# Patient Record
Sex: Male | Born: 1965 | Race: White | Hispanic: No | Marital: Married | Smoking: Never smoker
Health system: Southern US, Community
[De-identification: ages and names within clinical notes are randomized; demographics above are authoritative.]

## PROBLEM LIST (undated history)

## (undated) DIAGNOSIS — F419 Anxiety disorder, unspecified: Secondary | ICD-10-CM

## (undated) DIAGNOSIS — B029 Zoster without complications: Secondary | ICD-10-CM

## (undated) DIAGNOSIS — T7840XA Allergy, unspecified, initial encounter: Secondary | ICD-10-CM

## (undated) DIAGNOSIS — E785 Hyperlipidemia, unspecified: Secondary | ICD-10-CM

## (undated) DIAGNOSIS — J180 Bronchopneumonia, unspecified organism: Secondary | ICD-10-CM

## (undated) DIAGNOSIS — H8110 Benign paroxysmal vertigo, unspecified ear: Secondary | ICD-10-CM

## (undated) DIAGNOSIS — U071 COVID-19: Secondary | ICD-10-CM

## (undated) HISTORY — DX: COVID-19: U07.1

## (undated) HISTORY — DX: Hyperlipidemia, unspecified: E78.5

## (undated) HISTORY — PX: POLYPECTOMY: SHX149

## (undated) HISTORY — PX: COLONOSCOPY: SHX174

## (undated) HISTORY — DX: Benign paroxysmal vertigo, unspecified ear: H81.10

## (undated) HISTORY — DX: Zoster without complications: B02.9

## (undated) HISTORY — DX: Anxiety disorder, unspecified: F41.9

## (undated) HISTORY — DX: Bronchopneumonia, unspecified organism: J18.0

## (undated) HISTORY — DX: Allergy, unspecified, initial encounter: T78.40XA

---

## 2000-10-09 ENCOUNTER — Ambulatory Visit (HOSPITAL_COMMUNITY): Admission: RE | Admit: 2000-10-09 | Discharge: 2000-10-09 | Payer: Self-pay | Admitting: *Deleted

## 2000-10-09 ENCOUNTER — Encounter: Payer: Self-pay | Admitting: *Deleted

## 2005-04-22 ENCOUNTER — Encounter: Admission: RE | Admit: 2005-04-22 | Discharge: 2005-04-22 | Payer: Self-pay | Admitting: Radiology

## 2005-05-02 ENCOUNTER — Ambulatory Visit: Payer: Self-pay | Admitting: Pulmonary Disease

## 2005-05-03 ENCOUNTER — Ambulatory Visit: Payer: Self-pay | Admitting: Pulmonary Disease

## 2005-06-06 HISTORY — PX: VASECTOMY: SHX75

## 2005-07-04 ENCOUNTER — Ambulatory Visit: Payer: Self-pay | Admitting: Pulmonary Disease

## 2005-10-19 ENCOUNTER — Ambulatory Visit: Payer: Self-pay | Admitting: Pulmonary Disease

## 2005-12-26 ENCOUNTER — Ambulatory Visit: Payer: Self-pay | Admitting: Internal Medicine

## 2006-02-08 ENCOUNTER — Ambulatory Visit: Payer: Self-pay | Admitting: Internal Medicine

## 2006-02-10 ENCOUNTER — Ambulatory Visit: Payer: Self-pay | Admitting: Internal Medicine

## 2006-02-17 ENCOUNTER — Ambulatory Visit: Payer: Self-pay | Admitting: Internal Medicine

## 2006-02-21 ENCOUNTER — Ambulatory Visit: Payer: Self-pay | Admitting: Internal Medicine

## 2006-02-23 ENCOUNTER — Ambulatory Visit: Payer: Self-pay | Admitting: Internal Medicine

## 2006-02-28 ENCOUNTER — Ambulatory Visit: Payer: Self-pay | Admitting: Internal Medicine

## 2006-03-02 ENCOUNTER — Ambulatory Visit: Payer: Self-pay | Admitting: Internal Medicine

## 2006-03-07 ENCOUNTER — Ambulatory Visit: Payer: Self-pay | Admitting: Internal Medicine

## 2006-03-10 ENCOUNTER — Ambulatory Visit: Payer: Self-pay | Admitting: Internal Medicine

## 2006-03-13 ENCOUNTER — Ambulatory Visit: Payer: Self-pay | Admitting: Internal Medicine

## 2006-03-14 ENCOUNTER — Ambulatory Visit: Payer: Self-pay | Admitting: Internal Medicine

## 2006-03-17 ENCOUNTER — Ambulatory Visit: Payer: Self-pay | Admitting: Internal Medicine

## 2006-03-21 ENCOUNTER — Ambulatory Visit: Payer: Self-pay | Admitting: Internal Medicine

## 2006-03-24 ENCOUNTER — Ambulatory Visit: Payer: Self-pay | Admitting: Internal Medicine

## 2006-03-28 ENCOUNTER — Ambulatory Visit: Payer: Self-pay | Admitting: Internal Medicine

## 2006-03-31 ENCOUNTER — Ambulatory Visit: Payer: Self-pay | Admitting: Internal Medicine

## 2006-04-03 ENCOUNTER — Ambulatory Visit: Payer: Self-pay | Admitting: Internal Medicine

## 2006-04-04 ENCOUNTER — Ambulatory Visit: Payer: Self-pay | Admitting: Internal Medicine

## 2006-04-11 ENCOUNTER — Ambulatory Visit: Payer: Self-pay | Admitting: Internal Medicine

## 2006-04-14 ENCOUNTER — Ambulatory Visit: Payer: Self-pay | Admitting: Internal Medicine

## 2006-04-18 ENCOUNTER — Ambulatory Visit: Payer: Self-pay | Admitting: Internal Medicine

## 2006-04-20 ENCOUNTER — Ambulatory Visit: Payer: Self-pay | Admitting: Internal Medicine

## 2006-04-24 ENCOUNTER — Ambulatory Visit: Payer: Self-pay | Admitting: Internal Medicine

## 2006-04-26 ENCOUNTER — Ambulatory Visit: Payer: Self-pay | Admitting: Internal Medicine

## 2006-05-01 ENCOUNTER — Ambulatory Visit: Payer: Self-pay | Admitting: Internal Medicine

## 2006-05-02 ENCOUNTER — Ambulatory Visit: Payer: Self-pay | Admitting: Internal Medicine

## 2006-05-04 ENCOUNTER — Ambulatory Visit: Payer: Self-pay | Admitting: Internal Medicine

## 2006-05-08 ENCOUNTER — Ambulatory Visit: Payer: Self-pay | Admitting: Internal Medicine

## 2006-05-12 ENCOUNTER — Ambulatory Visit: Payer: Self-pay | Admitting: Internal Medicine

## 2006-05-16 ENCOUNTER — Ambulatory Visit: Payer: Self-pay | Admitting: Internal Medicine

## 2006-05-19 ENCOUNTER — Ambulatory Visit: Payer: Self-pay | Admitting: Internal Medicine

## 2006-05-24 ENCOUNTER — Ambulatory Visit: Payer: Self-pay | Admitting: Internal Medicine

## 2006-06-02 ENCOUNTER — Ambulatory Visit: Payer: Self-pay | Admitting: Internal Medicine

## 2006-06-06 HISTORY — PX: OTHER SURGICAL HISTORY: SHX169

## 2006-06-08 ENCOUNTER — Ambulatory Visit: Payer: Self-pay | Admitting: Internal Medicine

## 2006-06-16 ENCOUNTER — Ambulatory Visit: Payer: Self-pay | Admitting: Internal Medicine

## 2006-06-20 ENCOUNTER — Ambulatory Visit: Payer: Self-pay | Admitting: Internal Medicine

## 2006-06-27 ENCOUNTER — Ambulatory Visit: Payer: Self-pay | Admitting: Internal Medicine

## 2006-07-06 ENCOUNTER — Ambulatory Visit: Payer: Self-pay | Admitting: Internal Medicine

## 2006-07-10 ENCOUNTER — Ambulatory Visit: Payer: Self-pay | Admitting: Internal Medicine

## 2006-07-17 ENCOUNTER — Ambulatory Visit: Payer: Self-pay | Admitting: Internal Medicine

## 2006-07-26 ENCOUNTER — Ambulatory Visit: Payer: Self-pay | Admitting: Internal Medicine

## 2006-08-07 ENCOUNTER — Ambulatory Visit: Payer: Self-pay | Admitting: Internal Medicine

## 2006-08-11 ENCOUNTER — Ambulatory Visit: Payer: Self-pay | Admitting: Internal Medicine

## 2006-08-18 ENCOUNTER — Ambulatory Visit: Payer: Self-pay | Admitting: Internal Medicine

## 2006-08-23 ENCOUNTER — Ambulatory Visit: Payer: Self-pay | Admitting: Internal Medicine

## 2006-08-31 ENCOUNTER — Ambulatory Visit: Payer: Self-pay | Admitting: Internal Medicine

## 2006-09-04 ENCOUNTER — Ambulatory Visit: Payer: Self-pay | Admitting: Internal Medicine

## 2006-09-11 ENCOUNTER — Ambulatory Visit: Payer: Self-pay | Admitting: Internal Medicine

## 2006-09-25 ENCOUNTER — Ambulatory Visit: Payer: Self-pay | Admitting: Internal Medicine

## 2006-09-26 ENCOUNTER — Ambulatory Visit: Payer: Self-pay | Admitting: Internal Medicine

## 2006-10-04 ENCOUNTER — Ambulatory Visit: Payer: Self-pay | Admitting: Internal Medicine

## 2006-10-13 ENCOUNTER — Ambulatory Visit: Payer: Self-pay | Admitting: Pulmonary Disease

## 2006-10-13 ENCOUNTER — Ambulatory Visit: Payer: Self-pay | Admitting: Internal Medicine

## 2006-10-20 ENCOUNTER — Ambulatory Visit: Payer: Self-pay | Admitting: Internal Medicine

## 2006-10-24 ENCOUNTER — Ambulatory Visit: Payer: Self-pay | Admitting: Internal Medicine

## 2006-10-31 ENCOUNTER — Ambulatory Visit: Payer: Self-pay | Admitting: Internal Medicine

## 2006-11-06 ENCOUNTER — Ambulatory Visit: Payer: Self-pay | Admitting: Internal Medicine

## 2006-11-22 ENCOUNTER — Ambulatory Visit: Payer: Self-pay | Admitting: Internal Medicine

## 2006-11-29 ENCOUNTER — Ambulatory Visit: Payer: Self-pay | Admitting: Internal Medicine

## 2006-12-11 ENCOUNTER — Ambulatory Visit: Payer: Self-pay | Admitting: Internal Medicine

## 2006-12-20 ENCOUNTER — Ambulatory Visit: Payer: Self-pay | Admitting: Internal Medicine

## 2006-12-27 ENCOUNTER — Ambulatory Visit: Payer: Self-pay | Admitting: Internal Medicine

## 2007-01-11 ENCOUNTER — Ambulatory Visit: Payer: Self-pay | Admitting: Internal Medicine

## 2007-01-15 ENCOUNTER — Ambulatory Visit: Payer: Self-pay | Admitting: Internal Medicine

## 2007-01-22 ENCOUNTER — Ambulatory Visit: Payer: Self-pay | Admitting: Internal Medicine

## 2007-01-29 ENCOUNTER — Ambulatory Visit: Payer: Self-pay | Admitting: Internal Medicine

## 2007-02-06 ENCOUNTER — Ambulatory Visit: Payer: Self-pay | Admitting: Internal Medicine

## 2007-02-12 ENCOUNTER — Ambulatory Visit: Payer: Self-pay | Admitting: Internal Medicine

## 2007-02-21 ENCOUNTER — Ambulatory Visit: Payer: Self-pay | Admitting: Internal Medicine

## 2007-02-27 ENCOUNTER — Ambulatory Visit: Payer: Self-pay | Admitting: Internal Medicine

## 2007-02-28 ENCOUNTER — Ambulatory Visit: Payer: Self-pay | Admitting: Internal Medicine

## 2007-03-06 ENCOUNTER — Ambulatory Visit: Payer: Self-pay | Admitting: Internal Medicine

## 2007-03-12 ENCOUNTER — Ambulatory Visit: Payer: Self-pay | Admitting: Internal Medicine

## 2007-03-20 ENCOUNTER — Ambulatory Visit: Payer: Self-pay | Admitting: Internal Medicine

## 2007-03-26 ENCOUNTER — Ambulatory Visit: Payer: Self-pay | Admitting: Internal Medicine

## 2007-04-05 ENCOUNTER — Ambulatory Visit: Payer: Self-pay | Admitting: Internal Medicine

## 2007-04-10 ENCOUNTER — Ambulatory Visit: Payer: Self-pay | Admitting: Internal Medicine

## 2007-04-19 ENCOUNTER — Ambulatory Visit: Payer: Self-pay | Admitting: Internal Medicine

## 2007-04-24 ENCOUNTER — Ambulatory Visit: Payer: Self-pay | Admitting: Internal Medicine

## 2007-05-01 ENCOUNTER — Ambulatory Visit: Payer: Self-pay | Admitting: Internal Medicine

## 2007-05-08 ENCOUNTER — Ambulatory Visit: Payer: Self-pay | Admitting: Internal Medicine

## 2007-05-15 ENCOUNTER — Ambulatory Visit: Payer: Self-pay | Admitting: Internal Medicine

## 2007-05-16 DIAGNOSIS — J4520 Mild intermittent asthma, uncomplicated: Secondary | ICD-10-CM | POA: Insufficient documentation

## 2007-05-16 DIAGNOSIS — J3089 Other allergic rhinitis: Secondary | ICD-10-CM

## 2007-05-16 DIAGNOSIS — J302 Other seasonal allergic rhinitis: Secondary | ICD-10-CM | POA: Insufficient documentation

## 2007-05-16 DIAGNOSIS — H1045 Other chronic allergic conjunctivitis: Secondary | ICD-10-CM | POA: Insufficient documentation

## 2007-05-16 DIAGNOSIS — J189 Pneumonia, unspecified organism: Secondary | ICD-10-CM | POA: Insufficient documentation

## 2007-05-16 DIAGNOSIS — J45909 Unspecified asthma, uncomplicated: Secondary | ICD-10-CM | POA: Insufficient documentation

## 2007-05-24 ENCOUNTER — Ambulatory Visit: Payer: Self-pay | Admitting: Internal Medicine

## 2007-05-28 ENCOUNTER — Ambulatory Visit: Payer: Self-pay | Admitting: Internal Medicine

## 2007-06-06 ENCOUNTER — Ambulatory Visit: Payer: Self-pay | Admitting: Internal Medicine

## 2007-06-12 ENCOUNTER — Ambulatory Visit: Payer: Self-pay | Admitting: Internal Medicine

## 2007-06-21 ENCOUNTER — Ambulatory Visit: Payer: Self-pay | Admitting: Internal Medicine

## 2007-07-05 ENCOUNTER — Ambulatory Visit: Payer: Self-pay | Admitting: Internal Medicine

## 2007-07-10 ENCOUNTER — Ambulatory Visit: Payer: Self-pay | Admitting: Internal Medicine

## 2007-07-11 ENCOUNTER — Ambulatory Visit: Payer: Self-pay | Admitting: Internal Medicine

## 2007-07-19 ENCOUNTER — Ambulatory Visit: Payer: Self-pay | Admitting: Internal Medicine

## 2007-07-27 ENCOUNTER — Ambulatory Visit: Payer: Self-pay | Admitting: Internal Medicine

## 2007-08-03 ENCOUNTER — Ambulatory Visit: Payer: Self-pay | Admitting: Internal Medicine

## 2007-08-10 ENCOUNTER — Ambulatory Visit: Payer: Self-pay | Admitting: Internal Medicine

## 2007-08-15 ENCOUNTER — Ambulatory Visit: Payer: Self-pay | Admitting: Internal Medicine

## 2007-08-21 ENCOUNTER — Ambulatory Visit: Payer: Self-pay | Admitting: Internal Medicine

## 2007-08-24 ENCOUNTER — Ambulatory Visit: Payer: Self-pay | Admitting: Internal Medicine

## 2007-08-24 DIAGNOSIS — J209 Acute bronchitis, unspecified: Secondary | ICD-10-CM | POA: Insufficient documentation

## 2007-08-24 DIAGNOSIS — J45909 Unspecified asthma, uncomplicated: Secondary | ICD-10-CM

## 2007-08-29 ENCOUNTER — Ambulatory Visit: Payer: Self-pay | Admitting: Internal Medicine

## 2007-09-04 ENCOUNTER — Ambulatory Visit: Payer: Self-pay | Admitting: Internal Medicine

## 2007-09-18 ENCOUNTER — Ambulatory Visit: Payer: Self-pay | Admitting: Internal Medicine

## 2007-09-25 ENCOUNTER — Ambulatory Visit: Payer: Self-pay | Admitting: Internal Medicine

## 2007-10-01 ENCOUNTER — Ambulatory Visit: Payer: Self-pay | Admitting: Internal Medicine

## 2007-10-12 ENCOUNTER — Ambulatory Visit: Payer: Self-pay | Admitting: Internal Medicine

## 2007-10-16 ENCOUNTER — Ambulatory Visit: Payer: Self-pay | Admitting: Internal Medicine

## 2007-10-24 ENCOUNTER — Ambulatory Visit: Payer: Self-pay | Admitting: Internal Medicine

## 2007-10-31 ENCOUNTER — Ambulatory Visit: Payer: Self-pay | Admitting: Internal Medicine

## 2007-11-20 ENCOUNTER — Ambulatory Visit: Payer: Self-pay | Admitting: Internal Medicine

## 2007-11-27 ENCOUNTER — Ambulatory Visit: Payer: Self-pay | Admitting: Internal Medicine

## 2007-11-28 ENCOUNTER — Ambulatory Visit: Payer: Self-pay | Admitting: Internal Medicine

## 2007-12-06 ENCOUNTER — Ambulatory Visit: Payer: Self-pay | Admitting: Internal Medicine

## 2007-12-12 ENCOUNTER — Ambulatory Visit: Payer: Self-pay | Admitting: Internal Medicine

## 2007-12-18 ENCOUNTER — Ambulatory Visit: Payer: Self-pay | Admitting: Internal Medicine

## 2008-01-14 ENCOUNTER — Ambulatory Visit: Payer: Self-pay | Admitting: Internal Medicine

## 2008-01-21 ENCOUNTER — Ambulatory Visit: Payer: Self-pay | Admitting: Internal Medicine

## 2008-01-30 ENCOUNTER — Ambulatory Visit: Payer: Self-pay | Admitting: Internal Medicine

## 2008-01-30 ENCOUNTER — Telehealth (INDEPENDENT_AMBULATORY_CARE_PROVIDER_SITE_OTHER): Payer: Self-pay | Admitting: *Deleted

## 2008-02-04 ENCOUNTER — Ambulatory Visit: Payer: Self-pay | Admitting: Internal Medicine

## 2008-02-14 ENCOUNTER — Ambulatory Visit: Payer: Self-pay | Admitting: Internal Medicine

## 2008-02-22 ENCOUNTER — Ambulatory Visit: Payer: Self-pay | Admitting: Pulmonary Disease

## 2008-02-22 ENCOUNTER — Ambulatory Visit: Payer: Self-pay | Admitting: Internal Medicine

## 2008-02-27 ENCOUNTER — Ambulatory Visit: Payer: Self-pay | Admitting: Internal Medicine

## 2008-03-10 ENCOUNTER — Ambulatory Visit: Payer: Self-pay | Admitting: Internal Medicine

## 2008-03-19 ENCOUNTER — Ambulatory Visit: Payer: Self-pay | Admitting: Internal Medicine

## 2008-03-26 ENCOUNTER — Ambulatory Visit: Payer: Self-pay | Admitting: Internal Medicine

## 2008-04-04 ENCOUNTER — Ambulatory Visit: Payer: Self-pay | Admitting: Internal Medicine

## 2008-04-09 ENCOUNTER — Ambulatory Visit: Payer: Self-pay | Admitting: Internal Medicine

## 2008-04-15 ENCOUNTER — Ambulatory Visit: Payer: Self-pay | Admitting: Internal Medicine

## 2008-04-24 ENCOUNTER — Ambulatory Visit: Payer: Self-pay | Admitting: Internal Medicine

## 2008-04-28 ENCOUNTER — Ambulatory Visit: Payer: Self-pay | Admitting: Internal Medicine

## 2008-05-09 ENCOUNTER — Ambulatory Visit: Payer: Self-pay | Admitting: Internal Medicine

## 2008-05-20 ENCOUNTER — Ambulatory Visit: Payer: Self-pay | Admitting: Internal Medicine

## 2008-06-16 ENCOUNTER — Ambulatory Visit: Payer: Self-pay | Admitting: Internal Medicine

## 2008-06-23 ENCOUNTER — Ambulatory Visit: Payer: Self-pay | Admitting: Internal Medicine

## 2008-07-02 ENCOUNTER — Ambulatory Visit: Payer: Self-pay | Admitting: Internal Medicine

## 2008-07-10 ENCOUNTER — Ambulatory Visit: Payer: Self-pay | Admitting: Internal Medicine

## 2008-07-17 ENCOUNTER — Ambulatory Visit: Payer: Self-pay | Admitting: Internal Medicine

## 2008-07-18 ENCOUNTER — Ambulatory Visit: Payer: Self-pay | Admitting: Internal Medicine

## 2008-07-21 ENCOUNTER — Ambulatory Visit: Payer: Self-pay | Admitting: Internal Medicine

## 2008-07-31 ENCOUNTER — Ambulatory Visit: Payer: Self-pay | Admitting: Internal Medicine

## 2008-08-04 ENCOUNTER — Ambulatory Visit: Payer: Self-pay | Admitting: Internal Medicine

## 2008-08-14 ENCOUNTER — Ambulatory Visit: Payer: Self-pay | Admitting: Internal Medicine

## 2008-08-21 ENCOUNTER — Ambulatory Visit: Payer: Self-pay | Admitting: Internal Medicine

## 2008-08-21 ENCOUNTER — Telehealth (INDEPENDENT_AMBULATORY_CARE_PROVIDER_SITE_OTHER): Payer: Self-pay | Admitting: *Deleted

## 2008-08-27 ENCOUNTER — Ambulatory Visit: Payer: Self-pay | Admitting: Internal Medicine

## 2008-09-01 ENCOUNTER — Ambulatory Visit: Payer: Self-pay | Admitting: Internal Medicine

## 2008-09-12 ENCOUNTER — Ambulatory Visit: Payer: Self-pay | Admitting: Internal Medicine

## 2008-09-17 ENCOUNTER — Ambulatory Visit: Payer: Self-pay | Admitting: Internal Medicine

## 2008-09-18 ENCOUNTER — Ambulatory Visit: Payer: Self-pay | Admitting: Internal Medicine

## 2008-09-22 ENCOUNTER — Ambulatory Visit: Payer: Self-pay | Admitting: Internal Medicine

## 2008-09-29 ENCOUNTER — Ambulatory Visit: Payer: Self-pay | Admitting: Internal Medicine

## 2008-10-10 ENCOUNTER — Ambulatory Visit: Payer: Self-pay | Admitting: Internal Medicine

## 2008-10-16 ENCOUNTER — Ambulatory Visit: Payer: Self-pay | Admitting: Internal Medicine

## 2008-10-23 ENCOUNTER — Ambulatory Visit: Payer: Self-pay | Admitting: Internal Medicine

## 2008-10-30 ENCOUNTER — Ambulatory Visit: Payer: Self-pay | Admitting: Internal Medicine

## 2008-11-05 ENCOUNTER — Ambulatory Visit: Payer: Self-pay | Admitting: Internal Medicine

## 2008-11-12 ENCOUNTER — Ambulatory Visit: Payer: Self-pay | Admitting: Internal Medicine

## 2008-11-19 ENCOUNTER — Ambulatory Visit: Payer: Self-pay | Admitting: Internal Medicine

## 2008-11-27 ENCOUNTER — Ambulatory Visit: Payer: Self-pay | Admitting: Internal Medicine

## 2008-12-03 ENCOUNTER — Ambulatory Visit: Payer: Self-pay | Admitting: Internal Medicine

## 2008-12-11 ENCOUNTER — Ambulatory Visit: Payer: Self-pay | Admitting: Internal Medicine

## 2009-01-05 ENCOUNTER — Ambulatory Visit: Payer: Self-pay | Admitting: Internal Medicine

## 2009-01-13 ENCOUNTER — Ambulatory Visit: Payer: Self-pay | Admitting: Internal Medicine

## 2009-01-21 ENCOUNTER — Ambulatory Visit: Payer: Self-pay | Admitting: Internal Medicine

## 2009-01-26 ENCOUNTER — Ambulatory Visit: Payer: Self-pay | Admitting: Internal Medicine

## 2009-01-30 ENCOUNTER — Telehealth (INDEPENDENT_AMBULATORY_CARE_PROVIDER_SITE_OTHER): Payer: Self-pay | Admitting: *Deleted

## 2009-02-02 ENCOUNTER — Ambulatory Visit: Payer: Self-pay | Admitting: Internal Medicine

## 2009-02-03 ENCOUNTER — Ambulatory Visit: Payer: Self-pay | Admitting: Internal Medicine

## 2009-02-10 ENCOUNTER — Ambulatory Visit: Payer: Self-pay | Admitting: Internal Medicine

## 2009-02-18 ENCOUNTER — Ambulatory Visit: Payer: Self-pay | Admitting: Internal Medicine

## 2009-02-25 ENCOUNTER — Ambulatory Visit: Payer: Self-pay | Admitting: Internal Medicine

## 2009-03-02 ENCOUNTER — Ambulatory Visit: Payer: Self-pay | Admitting: Internal Medicine

## 2009-03-09 ENCOUNTER — Ambulatory Visit: Payer: Self-pay | Admitting: Internal Medicine

## 2009-03-18 ENCOUNTER — Ambulatory Visit: Payer: Self-pay | Admitting: Internal Medicine

## 2009-03-23 ENCOUNTER — Ambulatory Visit: Payer: Self-pay | Admitting: Internal Medicine

## 2009-04-03 ENCOUNTER — Ambulatory Visit: Payer: Self-pay | Admitting: Internal Medicine

## 2009-04-08 ENCOUNTER — Ambulatory Visit: Payer: Self-pay | Admitting: Internal Medicine

## 2009-04-16 ENCOUNTER — Ambulatory Visit: Payer: Self-pay | Admitting: Internal Medicine

## 2009-04-27 ENCOUNTER — Ambulatory Visit: Payer: Self-pay | Admitting: Internal Medicine

## 2009-05-05 ENCOUNTER — Ambulatory Visit: Payer: Self-pay | Admitting: Internal Medicine

## 2009-05-12 ENCOUNTER — Ambulatory Visit: Payer: Self-pay | Admitting: Internal Medicine

## 2009-05-20 ENCOUNTER — Ambulatory Visit: Payer: Self-pay | Admitting: Internal Medicine

## 2009-05-27 ENCOUNTER — Ambulatory Visit: Payer: Self-pay | Admitting: Internal Medicine

## 2009-06-03 ENCOUNTER — Ambulatory Visit: Payer: Self-pay | Admitting: Internal Medicine

## 2009-06-09 ENCOUNTER — Ambulatory Visit: Payer: Self-pay | Admitting: Internal Medicine

## 2009-06-10 ENCOUNTER — Ambulatory Visit: Payer: Self-pay | Admitting: Internal Medicine

## 2009-06-19 ENCOUNTER — Ambulatory Visit: Payer: Self-pay | Admitting: Internal Medicine

## 2009-06-24 ENCOUNTER — Ambulatory Visit: Payer: Self-pay | Admitting: Internal Medicine

## 2009-07-01 ENCOUNTER — Ambulatory Visit: Payer: Self-pay | Admitting: Internal Medicine

## 2009-07-08 ENCOUNTER — Ambulatory Visit: Payer: Self-pay | Admitting: Internal Medicine

## 2009-07-16 ENCOUNTER — Ambulatory Visit: Payer: Self-pay | Admitting: Internal Medicine

## 2009-07-21 ENCOUNTER — Ambulatory Visit: Payer: Self-pay | Admitting: Internal Medicine

## 2009-08-05 ENCOUNTER — Ambulatory Visit: Payer: Self-pay | Admitting: Internal Medicine

## 2009-08-10 ENCOUNTER — Ambulatory Visit: Payer: Self-pay | Admitting: Internal Medicine

## 2009-08-20 ENCOUNTER — Ambulatory Visit: Payer: Self-pay | Admitting: Internal Medicine

## 2009-08-28 ENCOUNTER — Ambulatory Visit: Payer: Self-pay | Admitting: Internal Medicine

## 2009-09-11 ENCOUNTER — Ambulatory Visit: Payer: Self-pay | Admitting: Internal Medicine

## 2009-09-18 ENCOUNTER — Ambulatory Visit: Payer: Self-pay | Admitting: Internal Medicine

## 2009-10-01 ENCOUNTER — Ambulatory Visit: Payer: Self-pay | Admitting: Internal Medicine

## 2009-10-09 ENCOUNTER — Ambulatory Visit: Payer: Self-pay | Admitting: Internal Medicine

## 2009-10-26 ENCOUNTER — Ambulatory Visit: Payer: Self-pay | Admitting: Internal Medicine

## 2009-11-05 ENCOUNTER — Ambulatory Visit: Payer: Self-pay | Admitting: Internal Medicine

## 2009-11-13 ENCOUNTER — Ambulatory Visit: Payer: Self-pay | Admitting: Internal Medicine

## 2009-11-17 ENCOUNTER — Ambulatory Visit: Payer: Self-pay | Admitting: Internal Medicine

## 2009-11-18 ENCOUNTER — Ambulatory Visit: Payer: Self-pay | Admitting: Internal Medicine

## 2009-11-27 ENCOUNTER — Ambulatory Visit: Payer: Self-pay | Admitting: Internal Medicine

## 2009-11-30 ENCOUNTER — Ambulatory Visit: Payer: Self-pay | Admitting: Internal Medicine

## 2010-07-06 NOTE — Assessment & Plan Note (Signed)
Summary: rov/apc   Primary Provider/Referring Provider:  Rolley Sims  CC:  rov and pt states he feels great and is breathing well. The patient has concerns about new insurance medication coverage.  History of Present Illness: 09/04/07-BRONCHITIS (ICD-490) PNEUMONIA (ICD-486) ASTHMA (ICD-493.90) ALLERGIC ASTHMA (ICD-493.00) ALLERGIC CONJUNCTIVITIS (ICD-372.14) ALLERGIC RHINITIS (ICD-477.9)  He had gone fo ra trip after I last saw him feeling sick.  Avelox I gave him cleared.the green sputum, but then he began coughing more, felt that he had caught a new cold from his children and has begun sneezing with dry harsh cough and maybe low-grade fever.  Is about to leave on a cruise in mid April and is worried because the cough .he has now reminds him how he felt once when he had pneumonia.  He has had pneumonia.each of the last two [place] and is getting nervous about this time of year.  We discussed medications, prophylaxis against colds and basic self-care.  May 05, 2009 Has done well. For a change he didn't have pneumonia this Spring. Doing well with Advair 500 and question is whether this can be reduced. He feels allergy vaccine has worked well for him. Worst trigger trigger is still viral colds. He didn't qualify for Xolair.  November 05, 2009- Allergic rhinitis, Asthma, Allergic rhinitis Feeling very well, doing great. His insurance will change and he won't know for awhile what is covered.  He is concerned about a gap when he may need to be off allergy vaccine for a while, with rebuild as needed depending on the specifics then. Throat bothers him more with allergy issues than his nose does. Prefers allegra 180.    Asthma History    Asthma Control Assessment:    Age range: 12+ years    Symptoms: 0-2 days/week    Nighttime Awakenings: 0-2/month    Interferes w/ normal activity: no limitations    SABA use (not for EIB): 0-2 days/week    Asthma Control Assessment:  Well Controlled   Preventive Screening-Counseling & Management  Alcohol-Tobacco     Smoking Status: never  Current Medications (verified): 1)  Zocor 10 Mg  Tabs (Simvastatin) .... Take 1 Tablet By Mouth Once A Day 2)  Advair Diskus 250-50 Mcg/dose Aepb (Fluticasone-Salmeterol) .Marland Kitchen.. 1 Puff and Rinse Twice Daily 3)  Singulair 10 Mg  Tabs (Montelukast Sodium) .... Take 1 Tablet By Mouth Once A Day 4)  Proscar 5 Mg  Tabs (Finasteride) .... Take 1 Tablet By Mouth Once A Day 5)  Maxair Autohaler 200 Mcg/inh  Aerb (Pirbuterol Acetate) .... As Needed 6)  Epipen 0.3 Mg/0.16ml Devi (Epinephrine) .... For Severe Allergic Reaction 7)  Allergy Vaccine 1:10 Gh  Allergies (verified): No Known Drug Allergies  Past History:  Past Medical History: Last updated: 08/24/2007 asthma recurrent bronchopneumonia  Past Surgical History: Last updated: 05/05/2009 Torn labrum right shoulder  Family History: Last updated: 05/05/2009 Parents living Daughter - allergic rhinitis  Social History: Last updated: 05/05/2009 Patient never smoked.  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    Vital Signs:  Patient profile:   45 year old male Height:      71 inches Weight:      184 pounds BMI:     25.76 O2 Sat:      97 % on Room air Pulse rate:   58 / minute BP sitting:   112 / 78  (left arm) Cuff size:   regular  Vitals Entered By: Denna Haggard, CMA (November 05, 2009 4:31 PM)  O2 Sat at Rest %:  97% O2 Flow:  Room air CC: rov,pt states he feels great and is breathing well. The patient has concerns about new insurance medication coverage   Physical Exam  Additional Exam:  General: A/Ox3; pleasant and cooperative, NAD, SKIN: no rash, lesions NODES: no lymphadenopathy HEENT: [place]/AT, EOM- WNL, Conjuctivae- clear, PERRLA, TM-WNL, Nose- clear, Throat- clear and wnl, Mallampatii II NECK: Supple w/ fair ROM, JVD- none, normal carotid impulses w/o bruits Thyroid-  CHEST: Clear to P&A HEART: RRR, no m/g/r heard ABDOMEN: Soft and nl;  ZOX:WRUE, nl pulses, no edema  NEURO: Grossly intact to observation      Impression & Recommendations:  Problem # 1:  ALLERGIC RHINITIS (ICD-477.9)  Good now, but had throat irritation in the Spring and mihgt have needed more then. We will continue allergy vaccine but he may need to go on hiatus and rebuild later this summer  Problem # 2:  ALLERGIC ASTHMA (ICD-493.00) Good control. We will give samples of Advair to bridge til insurance resumes.  Other Orders: Est. Patient Level III (45409)  Patient Instructions: 1)  Please schedule a follow-up appointment in 6 months. 2)  Talk with the allergy lab as you determine what you need to do about allergy vaccine.  3)  Call tomorrow to speak with Lorenza Evangelist about billing for allergy shots 4)  samples Advair 250. # 4 5)  samples singulair if available

## 2010-07-06 NOTE — Miscellaneous (Signed)
Summary: Injection Record/[facility]  Injection Record/[facility]   Imported By: Sherian Rein 10/13/2009 07:37:13  _____________________________________________________________________  External Attachment:    Type:   Image     Comment:   External Document

## 2010-07-06 NOTE — Miscellaneous (Signed)
Summary: Injection Record/[facility]  Injection Record/[facility]   Imported By: Sherian Rein 10/27/2009 12:03:46  _____________________________________________________________________  External Attachment:    Type:   Image     Comment:   External Document

## 2010-07-06 NOTE — Miscellaneous (Signed)
Summary: Injection Record / [facility]    Injection Record / [facility]    Imported By: Lennie Odor 02/05/2010 10:58:38  _____________________________________________________________________  External Attachment:    Type:   Image     Comment:   External Document

## 2010-08-05 ENCOUNTER — Encounter: Payer: Self-pay | Admitting: Internal Medicine

## 2010-08-05 ENCOUNTER — Ambulatory Visit (INDEPENDENT_AMBULATORY_CARE_PROVIDER_SITE_OTHER): Payer: 59 | Admitting: Internal Medicine

## 2010-08-05 DIAGNOSIS — J45909 Unspecified asthma, uncomplicated: Secondary | ICD-10-CM

## 2010-08-05 DIAGNOSIS — J309 Allergic rhinitis, unspecified: Secondary | ICD-10-CM

## 2010-08-05 DIAGNOSIS — H1045 Other chronic allergic conjunctivitis: Secondary | ICD-10-CM

## 2010-08-06 ENCOUNTER — Encounter: Payer: Self-pay | Admitting: Internal Medicine

## 2010-08-10 ENCOUNTER — Ambulatory Visit (INDEPENDENT_AMBULATORY_CARE_PROVIDER_SITE_OTHER): Payer: 59

## 2010-08-11 ENCOUNTER — Encounter: Payer: Self-pay | Admitting: Internal Medicine

## 2010-08-11 DIAGNOSIS — J301 Allergic rhinitis due to pollen: Secondary | ICD-10-CM | POA: Insufficient documentation

## 2010-08-17 NOTE — Assessment & Plan Note (Signed)
Summary: EXTRACT/10/CB  Nurse Visit   Allergies: No Known Drug Allergies  Orders Added: 1)  [facility] Services,1 or multi ([facility]) [95165] 

## 2010-08-17 NOTE — Assessment & Plan Note (Signed)
Summary: rov ///kp   Primary Provider/Referring Provider:  Rolley Sims  CC:  follow up visit-requests to restart allergy vaccine-would like to try to get shots here.Marland Kitchen  History of Present Illness: May 05, 2009 Has done well. For a change he didn't have pneumonia this Spring. Doing well with Advair 500 and question is whether this can be reduced. He feels allergy vaccine has worked well for him. Worst trigger trigger is still viral colds. He didn't qualify for Xolair.  November 05, 2009- Allergic rhinitis, Asthma, Allergic rhinitis Feeling very well, doing great. His insurance will change and he won't know for awhile what is covered.  He is concerned about a gap when he may need to be off allergy vaccine for a while, with rebuild as needed depending on the specifics then. Throat bothers him more with allergy issues than his nose does. Prefers allegra 180.  August 05, 2010-  Allergic rhinitis, Asthma, Allergic rhinitis Nurse-CC: follow up visit-requests to restart allergy vaccine-would like to try to get shots here. Stopped allergy shots July 1 with insurance change- now wants to restart.  Used otc antihistamines esp for itchy throat. No wheezing.    Asthma History    Initial Asthma Severity Rating:    Age range: 12+ years    Symptoms: 0-2 days/week    Nighttime Awakenings: 0-2/month    Interferes w/ normal activity: no limitations    SABA use (not for EIB): 0-2 days/week    Asthma Severity Assessment: Intermittent   Preventive Screening-Counseling & Management  Alcohol-Tobacco     Smoking Status: never  Current Medications (verified): 1)  Zocor 40 Mg Tabs (Simvastatin) .... Take 1 By Mouth Once Daily 2)  Advair Diskus 250-50 Mcg/dose Aepb (Fluticasone-Salmeterol) .Marland Kitchen.. 1 Puff and Rinse Twice Daily 3)  Singulair 10 Mg  Tabs (Montelukast Sodium) .... Take 1 Tablet By Mouth Once A Day 4)  Proscar 5 Mg  Tabs (Finasteride) .... Take 1 Tablet By Mouth Once A  Day 5)  Maxair Autohaler 200 Mcg/inh  Aerb (Pirbuterol Acetate) .... As Needed 6)  Epipen 0.3 Mg/0.68ml Devi (Epinephrine) .... For Severe Allergic Reaction 7)  Allergy Vaccine 1:10 Gh 8)  Lexapro 20 Mg Tabs (Escitalopram Oxalate) .... Take 1 By Mouth Once Daily  Allergies (verified): No Known Drug Allergies  Past History:  Past Medical History: Last updated: 08/24/2007 asthma recurrent bronchopneumonia  Past Surgical History: Last updated: 05/05/2009 Torn labrum right shoulder  Family History: Last updated: 05/05/2009 Parents living Daughter - allergic rhinitis  Social History: Last updated: 08/05/2010 Patient never smoked.  Father is retired Investment banker, operational brother is physician Medical sales- Automatic external defibillators  Risk Factors: Smoking Status: never (08/05/2010)  Social History: Patient never smoked.  Father is retired Investment banker, operational brother is Secondary school teacher- Automatic external defibillators  Review of Systems      See HPI  The patient denies shortness of breath with activity, shortness of breath at rest, productive cough, non-productive cough, coughing up blood, chest pain, irregular heartbeats, acid heartburn, indigestion, loss of appetite, weight change, abdominal pain, difficulty swallowing, sore throat, tooth/dental problems, headaches, nasal congestion/difficulty breathing through nose, and sneezing.    Vital Signs:  Patient profile:   45 year old male Height:      71 inches Weight:      189.38 pounds BMI:     26.51 O2 Sat:      97 % on Room air Pulse rate:   54 / minute BP sitting:  126 / 72  (left arm) Cuff size:   regular  Vitals Entered By: Reynaldo Minium CMA (August 05, 2010 3:37 PM)  O2 Flow:  Room air CC: follow up visit-requests to restart allergy vaccine-would like to try to get shots here.   Physical Exam  Additional Exam:  General: A/Ox3; pleasant and cooperative, NAD, SKIN: no rash, lesions NODES: no  lymphadenopathy HEENT: [place]/AT, EOM- WNL, Conjuctivae- clear, PERRLA, TM-WNL, Nose- clear, Throat- clear and wnl, Mallampatii II NECK: Supple w/ fair ROM, JVD- none, normal carotid impulses w/o bruits Thyroid-  CHEST: Clear to P&A HEART: RRR, no m/g/r heard ABDOMEN: Soft and nl;  HKV:QQVZ, nl pulses, no edema  NEURO: Grossly intact to observation      Impression & Recommendations:  Problem # 1:  ALLERGIC RHINITIS (ICD-477.9)  We are going to let him restart his vaccine at 1:50 and rebuild. He will likely need to supplement with otc antihistamines this Spring as dose rebuilds.   Problem # 2:  ASTHMA (ICD-493.90) We will give parallel scripts for Advair 100 and -250 so that he can try the lower dose for stab ility.   Medications Added to Medication List This Visit: 1)  Zocor 40 Mg Tabs (Simvastatin) .... Take 1 by mouth once daily 2)  Allergy Vaccine 1:50  .... Restart from 1:50 and rebuild 3)  Lexapro 20 Mg Tabs (Escitalopram oxalate) .... Take 1 by mouth once daily 4)  Advair Diskus 100-50 Mcg/dose Aepb (Fluticasone-salmeterol) .Marland Kitchen.. 1 puff and rise mouth, twice daily  Other Orders: Est. Patient Level III (56387)  Patient Instructions: 1)  Please schedule a follow-up appointment in 6 months. 2)  Parallel scripts for Advair 250 and 100- samie instructions 3)     Sample advair 100 4)  I will restart your vaccine at 1:50 strength and the lab will help you rebuild.  5)  Refill scripts.  Prescriptions: EPIPEN 0.3 MG/0.3ML DEVI (EPINEPHRINE) For severe allergic reaction  #1 x prn   Entered and Authorized by:   Waymon Budge MD   Signed by:   Waymon Budge MD on 08/05/2010   Method used:   Print then Give to Patient   RxID:   [id number] MAXAIR AUTOHALER 200 MCG/INH  AERB (PIRBUTEROL ACETATE) as needed  #1 x prn   Entered and Authorized by:   Waymon Budge MD   Signed by:   Waymon Budge MD on 08/05/2010   Method used:   Print then Give to Patient   RxID:    [id number] SINGULAIR 10 MG  TABS (MONTELUKAST SODIUM) Take 1 tablet by mouth once a day  #30 Tablet x prn   Entered and Authorized by:   Waymon Budge MD   Signed by:   Waymon Budge MD on 08/05/2010   Method used:   Print then Give to Patient   RxID:   [id number] ADVAIR DISKUS 250-50 MCG/DOSE AEPB (FLUTICASONE-SALMETEROL) 1 puff and rinse twice daily  #60 Not Speci x prn   Entered and Authorized by:   Waymon Budge MD   Signed by:   Waymon Budge MD on 08/05/2010   Method used:   Print then Give to Patient   RxID:   [id number] ADVAIR DISKUS 100-50 MCG/DOSE AEPB (FLUTICASONE-SALMETEROL) 1 puff and rise mouth, twice daily  #1 x prn   Entered and Authorized by:   Waymon Budge MD   Signed by:   Waymon Budge MD on 08/05/2010   Method used:  Print then Give to Patient   RxID:   [id number]

## 2010-08-24 NOTE — Miscellaneous (Signed)
Summary: Restart vaccine [facility]   Imported By: Sherian Rein 08/16/2010 14:12:46  _____________________________________________________________________  External Attachment:    Type:   Image     Comment:   External Document

## 2010-09-21 ENCOUNTER — Ambulatory Visit (INDEPENDENT_AMBULATORY_CARE_PROVIDER_SITE_OTHER): Payer: 59

## 2010-09-21 DIAGNOSIS — J309 Allergic rhinitis, unspecified: Secondary | ICD-10-CM

## 2010-09-30 ENCOUNTER — Ambulatory Visit (INDEPENDENT_AMBULATORY_CARE_PROVIDER_SITE_OTHER): Payer: 59

## 2010-09-30 DIAGNOSIS — J309 Allergic rhinitis, unspecified: Secondary | ICD-10-CM

## 2010-10-05 ENCOUNTER — Ambulatory Visit (INDEPENDENT_AMBULATORY_CARE_PROVIDER_SITE_OTHER): Payer: 59

## 2010-10-05 DIAGNOSIS — J309 Allergic rhinitis, unspecified: Secondary | ICD-10-CM

## 2010-10-12 ENCOUNTER — Ambulatory Visit (INDEPENDENT_AMBULATORY_CARE_PROVIDER_SITE_OTHER): Payer: 59

## 2010-10-12 DIAGNOSIS — J309 Allergic rhinitis, unspecified: Secondary | ICD-10-CM

## 2010-10-19 NOTE — Assessment & Plan Note (Signed)
[facility]                             PULMONARY OFFICE NOTE   NAME:KREGERadwan, Cowley                        MRN:          [id number]  DATE:10/13/2006                            DOB:          Jun 28, 1965    PROBLEM LIST:  1. Allergic rhinitis.  2. Allergic conjunctivitis.  3. Allergic and non-allergic asthma, intermittent.  4. Recent pneumonia.   Mr. Vanwey was treated at [facility] approximately  two weeks ago for pneumonia.  He had developed symptoms of what sounded  like a viral upper respiratory tract infection, which he says he picked  up from one of his kids.  This went into getting progressively more  short of breath with wheezing and chest tightness, associated with  coughing, with production of greenish sputum with some blood mixed in.  [facility].  Per report, a chest x-ray  was done, which showed a left lower lobe infiltrate.  He was given an  injection of Rocephin and then an eight-day course of Avalox.  He did  not apparently require the use of systemic steroids.  He has since  improved with his symptoms and is back to his baseline.   CURRENT MEDICATIONS:  1. Zocor 10 mg daily.  2. Advair 500/50 one puff b.i.d.  3. Proscar 1.25 mg daily.  4. Singulair 10 mg daily.  5. Allergy injections once weekly.  6. Maxair as needed.   PHYSICAL EXAM:  He is 174 pounds, temperature is 97.5, blood pressure is  104/80, heart rate is 51, oxygen saturation 96% on room air.  HEENT:  There is no sinus tenderness, no nasal discharge, no oral  lesions, no lymphadenopathy.  HEART:  S1, S2, regular rate and rhythm.  CHEST:  He has bronchial breath sounds bilaterally, but no wheezing or  rales.  ABDOMEN:  Soft, nontender.  EXTREMITIES:  There was no edema.   IMPRESSION:  Asthma with recent episode of pneumonia.  He appears to  have improved clinically from this.  I do not think he needs to have  repeat chest x-ray done at this time.  I do not think he needs to have  repeat spirometric measures done at this time.  I had had a lengthy  discussion with him about different techniques he could use to try and  minimize his risk of having repeated episodes of pneumonia, specifically  with regards to his hand hygiene, as well as to not delay if he notices  any worsening of his asthma-related symptoms.  Another consideration  could be to having him undergo evaluation for the use of Xolair,  although he said he would previously evaluate for this, but did not  qualify, but again this may be something worth investigating more.  I  have also advised him that, since it appears that his symptoms typically  worsen during the spring time, it may be beneficial for him to use an  antihistamine in conjunction with his other medical regimen.   I will have him follow up with Dr. Maple Hudson in  approximately one month.     Coralyn Helling, MD  Electronically Signed    VS/MedQ  DD: 10/13/2006  DT: 10/13/2006  Job #: [id number]   cc:   Joni Fears D. Maple Hudson, MD, FCCP, FACP

## 2010-10-22 NOTE — Assessment & Plan Note (Signed)
[facility]                               PULMONARY OFFICE NOTE   NAME:[name], [name]                        MRN:          [id number]  DATE:12/26/2005                            DOB:          04-05-1966    ALLERGY CONSULTATION:   PROBLEM:  [age]-year-old nonsmoker with chronic asthma and rhinitis,  who has been considered for Xolair therapy.  Allergy consultation is  requested to assist in management of his chronic asthma.   HISTORY:  He has had asthma and allergic rhinitis since childhood.  He had  allergy testing and was on allergy vaccine as a child.  He also required  hospitalization for asthma as a child, but not since.  He had a total IgE of  44.2 on Oct 19, 2005, with elevated specific IgEs for a number of common  environmental allergens.  He notices a strong seasonal pattern of itching  eyes, nasal congestion, itching palate.  Asthma has been more of a problem  for him than either eye or nasal symptoms.  Viral cold infections are  significant winter triggers.  Cat and dog exposures also are substantial  with chest tightness if he just sits for long in a room where a cat has  been.  PFTs have drifted down and he is concerned about remodeling.   MEDICATIONS:  1.  Zocor 20 mg.  2.  Advair 250/50 b.i.d.  3.  Proscar.  4.  Singulair 10 mg.  5.  He uses Maxair as a rescue inhaler.   ALLERGIES:  No medication allergy.   REVIEW OF SYSTEMS:  Currently, he feels stable without pain, palpitation,  edema, frank reflux, sinus pain, purulent or bloody discharge or adenopathy.  He reports they had a dog for about a year, which caused a major decline  in his breathing.  They got rid of the dog and have built a brand new house  which is immaculate.  The only indoor pet now is a hamster.  There is no  significant dust, mold or pollen exposure associated with the home.  He  actively avoid cigarette smokers.   PAST HISTORY:  1.  Asthma and  allergic rhinitis since childhood.  2.  Dermatology treatment for rosacea.  3.  No problems with foods, insect stings, eczema or atopic dermatitis or      his ears.  No recognized problems with exposure to Latex, contrast dye      or aspirin.   SOCIAL:  Never smoked.  Two to 3 beers a week.  Married with 2 children.  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   FAMILY:  Mother and sister with allergy history, grandfather with heart  disease, grandparents with cancer.   OBJECTIVE:  VITAL SIGNS:  Weight 177 pounds.  BP 102/73, pulse regular at  63.  Room air saturation 97%.  GENERAL:  He is a tall, muscular, comfortable-appearing man.  SKIN:  No rash.  ADENOPATHY:  None found.  HEENT:  Conjunctivae, nasal mucosa and oropharynx are unremarkable today.  Voice quality is  normal.  NECK:  No neck vein distention.  CHEST:  Quiet clear chest without wheeze, cough or rales.  Work of breathing  is not increased.  CARDIAC:  Heart sounds are regular without murmur or gallop.  ABDOMEN:  No hepatosplenomegaly.  EXTREMITIES:  No cyanosis,  clubbing or edema.   SPIROMETRY:  Mild obstructive disease, mainly in small airways, where there  is insignificant response to bronchodilator when tested on May 03, 2005.  Normal lung volumes and diffusion.   IMPRESSION:  1.  Allergic rhinitis.  2.  Allergic conjunctivitis.  3.  Allergic and nonallergic asthma, intermittent, controlled.   PLAN:  1.  We discussed environmental precautions.  2.  Scheduled return for allergy skin testing.                                   Clinton D. Maple Hudson, MD, [facility], FACP   CDY/MedQ  DD:  12/26/2005  DT:  12/27/2005  Job #:  [id number]   cc:   Gailen Shelter, MD

## 2010-10-22 NOTE — Assessment & Plan Note (Signed)
[facility]                               PULMONARY OFFICE NOTE   NAME:Andrew, Huynh                        MRN:          [id number]  DATE:02/08/2006                            DOB:          Mar 25, 1966    PULMONARY ALLERGY FOLLOWUP:   PROBLEMS:  1. Allergic rhinitis.  2. Allergic conjunctivitis.  3. Allergic and nonallergic asthma, intermittent, controlled.   HISTORY:  [name] returns for followup of his asthma with a  fixed/remodeled obstructive component.  He has recently had a cold he  attributes to his children with some green sputum.  There is noted skin  itching without rash.  He comes today for allergy testing.   MEDICATIONS:  1. Zocor 10 mg.  2. Advair 250/50.  3. Proscar 1.25 mg.  4. Singulair 10 mg.  5. Maxair inhaler.   No medication allergies.   OBJECTIVE:  VITAL SIGNS:  Weight 180 pounds.  BP 88/66, pulse regular at 56,  room air saturation 98%.  GENERAL:  A muscular, fit-appearing young man with mild nasal congestion.  Slightly inspiratory wheeze.  No adenopathy.  Pharynx not red.   SKIN TEST:  A strong positive puncture and intradermal reactions for grass,  weed, and some tree pollens, dust, dust mite, cat, dog, and selected molds.  He also had puncture reactions with mild swelling and erythema to lamb,  chicken, egg white, milk, and wheat.  He will watch for these as issues.   IMPRESSION:  1. Allergic rhinitis.  2. Significant allergic component to his fixed chronic asthma.  3. Incidental upper respiratory infection currently, which did not      interfere with skin testing.   PLAN:  1. Doxycycline to hold for this weekend.  2. Increase fluids and symptomatic therapy for the acute illness.  3. Environmental precautions and information provided.  4. We discussed allergy vaccine, risks, goals, and options.  He has been      wanting to start.  We specifically reviewed anaphylaxis, epinephrine,      and the potential  that allergy vaccine might not help or it might cause      harm.  He wants to start here as soon as possible.   PLAN:  Start allergy vaccine and build towards maintenance with return to  see me in two months, earlier p.r.n.                                   Clinton D. Maple Hudson, MD, FCCP, FACP   CDY/MedQ  DD:  02/10/2006  DT:  02/10/2006  Job #:  [id number]

## 2010-10-22 NOTE — Assessment & Plan Note (Signed)
[facility]                             PULMONARY OFFICE NOTE   NAME:[name], [name]                        MRN:          [id number]  DATE:08/11/2006                            DOB:          1965-07-05    PROBLEM LIST:  1. Allergic rhinitis.  2. Allergic conjunctivitis.  3. Allergic and non allergic asthma, intermittent, controlled.   HISTORY:  He returns for scheduled follow-up after a good winter. He  thinks asthma has done better. There have been no problems with his  allergy vaccine which is at 1:50.  Best peak flow was 550-560 with more  typical range 510-520.   MEDICATION:  Allergy vaccine, Zocor 10 mg daily, Advair 250/50, Proscar  1.25 mg, Singulair 10 mg, Maxair Rescue inhaler. He has an EpiPen which  has not been needed. No medication allergy.   We reviewed risk and benefit issues of allergy vaccine, issues related  to administration outside of a medical office, anaphylaxis and  epinephrine. He indicates understanding and agreement and does wish to  get his own injections. We have discussed this in the context of  advancing to 1:10.   OBJECTIVE:  VITALS: Weight 180 pounds, blood pressure 104/66, pulse  regular 55, room air saturation 97%.  EYES/NOSE/CHEST: Clear today.  HEART: Normal heart sounds.   IMPRESSION:  Better control of rhinitis and asthma.   We will continue environmental precautions as we move into the spring  pollen season. We are moving his vaccine strength up to 1:10. Schedule  return 6 months, earlier p.r.n.     Clinton D. Maple Hudson, MD, [facility], FACP  Electronically Signed    CDY/MedQ  DD: 08/12/2006  DT: 08/12/2006  Job #: [id number]

## 2010-10-22 NOTE — Assessment & Plan Note (Signed)
[facility]                               PULMONARY OFFICE NOTE   NAME:[name], Abalos                        MRN:          [id number]  DATE:04/11/2006                            DOB:          June 18, 1965    PROBLEM:  1. Allergic rhinitis.  2. Allergic conjunctivitis.  3. Allergic and nonallergic asthma, intermittent, controlled.   HISTORY:  He has felt that his breathing was better recently with peak flows  running around 500.  I told him it was more likely this was related to  favorable season and weather rather than an early response to allergy  vaccine, but he is continuing with vaccine buildup with no problems at all.  He has had an incidental cold, which he has shaken off.   MEDICATIONS:  1. Zocor 20 mg.  2. Advair 250/50.  3. Proscar.  4. Singulair 10 mg.  5. Maxair inhaler.   NO MEDICATION ALLERGY.   OBJECTIVE:  Weight 178 pounds, which is appropriate for his height.  BP  122/78.  Pulse regular at 57.  Room air saturation 97%.  Mild hoarseness.  His pharynx is not irritated.  I find no adenopathy or  stridor.  There is minimal nasal congestion.  LUNGS:  Very clear.  HEART:  Sounds are normal.   IMPRESSION:  1. Recent viral upper respiratory infection.  2. Allergic rhinitis.  3. Mild intermittent asthma.   PLAN:  1. He will continue building vaccine to 1 to 50 and hold there.  2. Schedule return in 4 months.  3. He would like to be able to give his own vaccine at home, and we will      have that discussion on return.    Clinton D. Maple Hudson, MD, Tonny Bollman, FACP  Electronically Signed   CDY/MedQ  DD: 04/11/2006  DT: 04/12/2006  Job #: [id number]

## 2010-11-02 ENCOUNTER — Ambulatory Visit (INDEPENDENT_AMBULATORY_CARE_PROVIDER_SITE_OTHER): Payer: 59

## 2010-11-02 DIAGNOSIS — J309 Allergic rhinitis, unspecified: Secondary | ICD-10-CM

## 2010-11-16 ENCOUNTER — Ambulatory Visit (INDEPENDENT_AMBULATORY_CARE_PROVIDER_SITE_OTHER): Payer: 59

## 2010-11-16 DIAGNOSIS — J309 Allergic rhinitis, unspecified: Secondary | ICD-10-CM

## 2010-11-26 ENCOUNTER — Ambulatory Visit (INDEPENDENT_AMBULATORY_CARE_PROVIDER_SITE_OTHER): Payer: 59

## 2010-11-26 DIAGNOSIS — J309 Allergic rhinitis, unspecified: Secondary | ICD-10-CM

## 2011-06-30 ENCOUNTER — Ambulatory Visit (INDEPENDENT_AMBULATORY_CARE_PROVIDER_SITE_OTHER): Payer: 59 | Admitting: Sports Medicine

## 2011-06-30 VITALS — BP 100/64

## 2011-06-30 DIAGNOSIS — M7700 Medial epicondylitis, unspecified elbow: Secondary | ICD-10-CM

## 2011-06-30 DIAGNOSIS — M7701 Medial epicondylitis, right elbow: Secondary | ICD-10-CM

## 2011-06-30 NOTE — Patient Instructions (Signed)
Please do wrist curls- with palm facing up holding a dumbbell - lower weight down slow then up fast  With palm up rotate to the right as far as you can- over slow then back fast  Ball squeezes  3 sets forehand motion and serving motion - holding dumbbell  Do 3 sets of 15 of each exercise  Start with 3lb weight then increase to 5lb dumbbell when this seems easy  Finger flexion with last 3 fingers on rt hand using an exercise band   Drop string tension on racquet to 48-50 lbs, use string debtener and larger grip

## 2011-06-30 NOTE — Progress Notes (Signed)
  Subjective:    Patient ID: [name], male    DOB: Jan 19, 1966, 46 y.o.   MRN: [id number]  HPI  Pt presents to clinic for evaluation of rt medial elbow pain x 10 weeks. 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  Saw Dr. August Saucer who did MRI and saw tendon tear in medial aspect that was 1 cm long. Certain movements cause pain still.  No tennis since injury.   Also fell onto point of elbow and has a mobile, slt tender nodule.      Review of Systems     Objective:   Physical Exam  Rt elbow- free floating body 4mm at olecranon, easily moveable- feels cystic  -4 degrees elbow extension on rt, and -3 on lt Full flexion bilat Full supination bilat, lacks 10 degrees full pronation on rt Resisted finger and wrist flexion painful Concentric finger flexion not painful Concentric wrist flexion causes some pain Resisted pronation slightly painful, resisted supination not painful  MSK ultrasound The right elbow is visualized and he has an intact triceps tendon. Over the olecranon there is a small fibrous nodule about 4 mm that does not appear to be fluid-filled. Over the medial epicondyles there is an area of calcification and scar tissue in the proximal flexor muscle mass that measures to be 1 cm in length There is no abnormal Doppler activity or hypoechoic change    Assessment & Plan:

## 2011-06-30 NOTE — Assessment & Plan Note (Signed)
I think he needs to move ahead with rehabilitation exercises to focus on eccentric strengthening work for the flexor tendons of the wrist and fingers of the right hand  He is to change his racquet stringing to just below 50 pounds; use a cushion grip; string dampner. Go back to a 4-5/8 grip size.  Pregnancy progresses the elbow exercises to 5 pounds and 10 recess without significant pain he should be ready to play tennis

## 2011-07-06 ENCOUNTER — Other Ambulatory Visit: Payer: 59 | Admitting: Family Medicine

## 2011-08-06 ENCOUNTER — Other Ambulatory Visit: Payer: Self-pay | Admitting: Internal Medicine

## 2011-08-10 ENCOUNTER — Ambulatory Visit (INDEPENDENT_AMBULATORY_CARE_PROVIDER_SITE_OTHER): Payer: 59 | Admitting: Sports Medicine

## 2011-08-10 ENCOUNTER — Encounter: Payer: Self-pay | Admitting: Sports Medicine

## 2011-08-10 VITALS — BP 119/75 | HR 49

## 2011-08-10 DIAGNOSIS — M7700 Medial epicondylitis, unspecified elbow: Secondary | ICD-10-CM

## 2011-08-10 DIAGNOSIS — M7701 Medial epicondylitis, right elbow: Secondary | ICD-10-CM

## 2011-08-10 MED ORDER — NITROGLYCERIN 0.2 MG/HR TD PT24: MEDICATED_PATCH | TRANSDERMAL | Status: DC

## 2011-08-10 NOTE — Patient Instructions (Addendum)
Try to keep you pain at a mild level - 3 out of 10 with playing tennis or working out  Continue doing your rehab exercises  Start using nitroglycerin 1/4 patch on medial right elbow   Nitroglycerin Protocol   Apply 1/4 nitroglycerin patch to affected area daily.  Change position of patch within the affected area every 24 hours.  You may experience a headache during the first 1-2 weeks of using the patch, these should subside.  If you experience headaches after beginning nitroglycerin patch treatment, you may take your preferred over the counter pain reliever.  Another side effect of the nitroglycerin patch is skin irritation or rash related to patch adhesive.  Please notify our office if you develop more severe headaches or rash, and stop the patch.  Tendon healing with nitroglycerin patch may require 12 to 24 weeks depending on the extent of injury.  Men should not use if taking Viagra, Cialis, or Levitra.   Do not use if you have migraines or rosacea.   Please follow up in 4- 6 weeks.  Thank you for seeing [place] today!

## 2011-08-10 NOTE — Assessment & Plan Note (Signed)
Tendon that on previous exam appeared healed shows a lot of significant scar tissue and calcification   clearly still one area of a small tear  This may have worsened somewhat racquetball so I want him to stop that as this requires a lot of wrist flexion Continue his rehabilitation exercises  He is on nitroglycerin protocol Reck 6 wks  use a compression sleeve  Okay to play tennis at a moderate level as long as he does not get significant pain but no competitive tennis

## 2011-08-10 NOTE — Progress Notes (Signed)
  Subjective:    Patient ID: [name], male    DOB: 12-26-1965, 46 y.o.   MRN: [id number]  HPI  Pt presents to clinic for f/u of rt medial elbow pain which he states has not improved since last visit. States he built up gradually to doing 5 lbs on home exercises without problems by 07/31/11.   He started playing racquet ball 1 week ago using elbow sleeve, started having significant medial elbow pain again.   MRI documented a 1 cm tear in the flexor tendons coming to the medial epicondyle this was on November 13 Injury was on November 10 during [place]  Ultrasound on last visit showed calcification and almost complete healing of the tear  He was able to improve his strength and get less pain with normal activities until he started racquetball  Now he is concerned because season is approaching and he doesn't feel like he can play without pain     Review of Systems     Objective:   Physical Exam No acute distress  Rt elbow exam: Negative book test Pain with resisted ulnar deviation Significant pain with resisted finger flexion of fingers 4 and 5 No pain with resisted wrist flexion Resisted pronation and supplementation reveal no significant pain  Full Range of motion of the elbow No swelling  SK ultrasound The medial epicondyles is visualized There is a small split noted in the flexor tendon bundle There is calcification and significant scar tissue noted on both long and transverse view of an area of previous injury He has abnormal Doppler flow/ neo- vessels noted        Assessment & Plan:

## 2011-08-15 ENCOUNTER — Telehealth: Payer: Self-pay | Admitting: Internal Medicine

## 2011-08-15 NOTE — Telephone Encounter (Signed)
Pt given 1 sample for Advair 100/50, we do not have samples of Singulair. Pt instructed to keep Ov on Fri., 3/15 with CDY.

## 2011-08-18 ENCOUNTER — Encounter: Payer: Self-pay | Admitting: Internal Medicine

## 2011-08-19 ENCOUNTER — Ambulatory Visit (INDEPENDENT_AMBULATORY_CARE_PROVIDER_SITE_OTHER): Payer: 59 | Admitting: Internal Medicine

## 2011-08-19 ENCOUNTER — Encounter: Payer: Self-pay | Admitting: Internal Medicine

## 2011-08-19 VITALS — BP 112/62 | HR 56 | Ht 71.0 in | Wt 195.4 lb

## 2011-08-19 DIAGNOSIS — J45909 Unspecified asthma, uncomplicated: Secondary | ICD-10-CM

## 2011-08-19 DIAGNOSIS — J301 Allergic rhinitis due to pollen: Secondary | ICD-10-CM

## 2011-08-19 MED ORDER — MONTELUKAST SODIUM 10 MG PO TABS: 10.0000 mg | ORAL_TABLET | Freq: Every day | ORAL | Status: DC

## 2011-08-19 MED ORDER — FLUTICASONE-SALMETEROL 100-50 MCG/DOSE IN AEPB: 1.0000 | INHALATION_SPRAY | Freq: Two times a day (BID) | RESPIRATORY_TRACT | Status: DC

## 2011-08-19 NOTE — Progress Notes (Signed)
08/19/11- 45 yoM never smoker followed for allergic rhinitis, hx allergic asthma, hx recurrent bronchopneumonia. LOV- doing well off of vaccine for the past year. No asthma. Had a cold a few weeks ago but shook it off. 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 FVC 5.69/109%, FEV1 3.38/81%, FEV1/FEC 0.59, FEF 25-75% 1.98/48%.  ROS-see HPI Constitutional:   No-   weight loss, night sweats, fevers, chills, fatigue, lassitude. HEENT:   No-  headaches, difficulty swallowing, tooth/dental problems, sore throat,       No-  sneezing, itching, ear ache, nasal congestion, post nasal drip,  CV:  No-   chest pain, orthopnea, PND, swelling in lower extremities, anasarca, dizziness, palpitations Resp: No-   shortness of breath with exertion or at rest.              No-   productive cough,  No non-productive cough,  No- coughing up of blood.              No-   change in color of mucus.  No- wheezing.   Skin: No-   rash or lesions. GI:  No-   heartburn, indigestion, abdominal pain, nausea, vomiting GU: . MS:  No-   joint pain or swelling.  Neuro-     nothing unusual Psych:  No- change in mood or affect. No depression or anxiety.  No memory loss.  OBJ- Physical Exam General- Alert, Oriented, Affect-appropriate, Distress- none acute Skin- rash-none, lesions- none, excoriation- none Lymphadenopathy- none Head- atraumatic            Eyes- Gross vision intact, PERRLA, conjunctivae and secretions clear            Ears- Hearing, canals-normal            Nose- Clear, no-Septal dev, mucus, polyps, erosion, perforation             Throat- Mallampati II , mucosa clear , drainage- none, tonsils- atrophic Neck- flexible , trachea midline, no stridor , thyroid nl, carotid no bruit Chest - symmetrical excursion , unlabored           Heart/CV- RRR , no murmur , no gallop  , no rub, nl s1 s2                           - JVD- none , edema- none, stasis changes- none, varices- none           Lung-  clear to P&A, wheeze- none, cough- none , dullness-none, rub- none           Chest wall-  Abd-  Br/ Gen/ Rectal- Not done, not indicated Extrem- cyanosis- none, clubbing, none, atrophy- none, strength- nl Neuro- grossly intact to observation

## 2011-08-19 NOTE — Patient Instructions (Signed)
Scripts sent for Advair and for Singulair/ montelukast.  Ok to try using the Advair once daily if that seems sufficient  Please call as needed

## 2011-08-22 NOTE — Assessment & Plan Note (Signed)
Very well controlled. Medications discussed and refilled. He can try taking Advair once daily.

## 2011-08-22 NOTE — Assessment & Plan Note (Signed)
Comfortable off of allergy vaccine.

## 2011-08-28 ENCOUNTER — Ambulatory Visit: Payer: 59 | Admitting: Physician Assistant

## 2011-08-28 ENCOUNTER — Encounter: Payer: Self-pay | Admitting: Physician Assistant

## 2011-08-28 VITALS — BP 113/67 | HR 49 | Temp 97.5°F | Resp 18 | Ht 71.0 in | Wt 190.0 lb

## 2011-08-28 DIAGNOSIS — R21 Rash and other nonspecific skin eruption: Secondary | ICD-10-CM

## 2011-08-28 MED ORDER — PREDNISONE 20 MG PO TABS: ORAL_TABLET | ORAL | Status: DC

## 2011-08-28 MED ORDER — METHYLPREDNISOLONE ACETATE 80 MG/ML IJ SUSP
80.0000 mg | Freq: Once | INTRAMUSCULAR | Status: AC
  Administered 2011-08-28: 80 mg via INTRAMUSCULAR

## 2011-08-28 NOTE — Patient Instructions (Signed)
Erythema Multiforme Erythema multiforme (EM) is a rash that occurs mostly on the skin. Sometimes it occurs on the lips and mouth. It is usually a mild illness that goes away on its own. It usually affects young adults in the spring and fall. It tends to be recurrent with each episode lasting 1 to 4 weeks. CAUSES  The cause of EM may be an overreaction by the body's immune system to a trigger (something that causes the body to react).  Common triggers include:  Infections, including:   Viruses.   Bacteria.   Fungi.   Parasites.   Medicines.  Less common triggers include:  Foods.   Chemicals.   Injuries to the skin.   Pregnancy.   Other illnesses.  In some cases the cause may not be known. SYMPTOMS  The rash from EM shows up suddenly. The rash may appear days after the trigger. It may start as small, red, round or oval marks that become bumps or raised welts over 24 to 48 hours. These can spread and be quite large (about one inch [several centimeters]). These skin changes usually appear first on the backs of the hands, then spread to the tops of the feet, arms, elbows, knees, palms and soles. There may be a mild rash on the lips and lining of the mouth. The skin rash may show up in waves over a few days. There may be mild itching or burning of the skin at first. It may take up to 4 weeks to go away. The rash may come back again at a later time. DIAGNOSIS  Diagnosis of EM is usually made by physical exam. Sometimes a skin biopsy is done if the diagnosis is not certain. A skin biopsy is the removal of a small piece of tissue which can be examined under a microscope by a specialist (pathologist). TREATMENT  Most episodes of EM heal on their own and treatment may not be needed. If possible, it is best to remove the trigger or treat the infection. If your trigger is a herpes virus infection (cold sore), use sunscreen lotion and sunscreen-containing lip balm to prevent sunlight triggered  outbreaks of herpes virus. Medicine for itching may be given. Medicines can be used for severe cases and to prevent repeat bouts of EM.  HOME CARE INSTRUCTIONS   If possible, avoid known triggers.   If a medicine was your trigger, be sure to notify all of your caregivers. You should avoid this medicine or any like it in the future.  SEEK MEDICAL CARE IF:   Your EM rash shows up again in the future  SEEK IMMEDIATE MEDICAL CARE IF:   Red, swollen lips or mouth develop.   Burning feeling in the mouth or lips.   Blisters or open sores in the mouth, lips, vagina, penis or anus.   Eye pain, redness or drainage.   Blisters on the skin.   Difficulty breathing.   Difficulty swallowing; drooling.   Blood in urine.   Pain with urinating.  Document Released: 05/23/2005 Document Revised: 05/12/2011 Document Reviewed: 05/09/2008 [facility] Patient Information 2012 [place].    The other potential is Urticaria or Hives Increase Zantac to 75 mg (2) every 12 hours Allegra is ok to use Watch for worsening symptoms as noted above

## 2011-08-28 NOTE — Progress Notes (Signed)
  Subjective:    Patient ID: [name], male    DOB: 08-29-1965, 46 y.o.   MRN: [id number]  HPI [name] comes in today with a rash for 3 days.  It is itchy and spreading.  On most of his body.  Spares mouth, palms and soles.  Started Minocycline 10 days ago for periorbital dermatitis.  He has never taken that antibiotic in the past.  He has been taking benadryl and zantac at his pharmacists recommendation with some relief.  He denies f/c, n/v lesions in the mouth, SOB or wheezing, swelling in throat or tightness in chest.    Review of Systems As above    Objective:   Physical Exam  Constitutional: Vital signs are normal. He appears well-developed and well-nourished.  Non-toxic appearance. He does not have a sickly appearance. He does not appear ill.  HENT:  Mouth/Throat: Uvula is midline, oropharynx is clear and moist and mucous membranes are normal. No uvula swelling.  Cardiovascular: Normal rate and regular rhythm.   Pulmonary/Chest: Effort normal and breath sounds normal.  Skin: Skin is warm and dry. Rash noted. Rash is urticarial.       Scattered erythematous lesions on forearms, chest, face, legs trunk. Palms and soles spared.  Multiple lesions with target appearance.     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 Reviewed Urticaria vs. E. Multiforme  D/C Minocycline and note allergy to TCN group  Depo medrol 80 mg Im now Prednisone 20 mg taper dose. May take [place].  Increase Zantac 75 mg to 2 bid.  Reviewed SJS sx and RTC precautions.

## 2011-10-28 ENCOUNTER — Ambulatory Visit (INDEPENDENT_AMBULATORY_CARE_PROVIDER_SITE_OTHER): Payer: 59 | Admitting: Family Medicine

## 2011-10-28 VITALS — BP 118/85

## 2011-10-28 DIAGNOSIS — M7700 Medial epicondylitis, unspecified elbow: Secondary | ICD-10-CM

## 2011-10-28 DIAGNOSIS — M7701 Medial epicondylitis, right elbow: Secondary | ICD-10-CM

## 2011-10-28 NOTE — Progress Notes (Signed)
  Subjective:    Patient ID: [name], male    DOB: 1966/01/05, 46 y.o.   MRN: [id number]  HPI [name] comes back in for followup of his right medial epicondylitis. He's been using nitroglycerin patches for over 2 months now, and is overall approximately 20% better. He is also doing the rehabilitation exercises, has used oral analgesics, and is using a compression sleeve.  Review of Systems    No fevers, chills, night sweats, weight loss, chest pain, or shortness of breath.  Social History: Non-smoker. Objective:   Physical Exam  General:  Well developed, well nourished, and in no acute distress. Neuro:  Alert and oriented x3, extra-ocular muscles intact. Skin: Warm and dry, no rashes noted. Respiratory:  Not using accessory muscles, speaking in full sentences. Musculoskeletal: Right elbow unremarkable to inspection, tender to palpation at the origin of the common flexor tendon. Some rash noted at the site of the nitroglycerin patch.  Real-time Ultrasound Guided Injection of: right common flexor tendon origin. Ultrasound guided injection is preferred based studies that show increased duration, increased effect, greater accuracy, decreased procedural pain, increased response rate, and decreased cost with ultrasound guided versus blind injection. Verbal informed consent obtained. Time-out conducted. Noted no overlying erythema, induration, or other signs of local infection. Skin prepped in a sterile fashion. Local anesthesia: Topical Ethyl chloride. With sterile technique and under real time ultrasound guidance: again we noted a small amount of calcification at the origin of the common flexor tendon at the medial epicondyle. Previous areas of calcifications have improved, and there is less neovascularization. The needle was inserted under real time ultrasound guidance, to the area of calcification, where the area of calcification was packed with multiple passes of the needle. Throughout  the procedure, 1 cc of Kenalog 10, 2 cc of lidocaine were injected. Completed without difficulty Pain immediately resolved suggesting accurate placement of the medication. Advised to call if fevers/chills, erythema, induration, drainage, or persistent bleeding. Images saved.     Assessment & Plan:

## 2011-10-28 NOTE — Assessment & Plan Note (Signed)
Ultrasound guided injection at the common flexor tendon origin. Continue elbow sleeve. Continue nitroglycerin, and home exercises. We will see him back in one month.

## 2011-12-13 ENCOUNTER — Ambulatory Visit: Payer: 59 | Admitting: Sports Medicine

## 2012-01-24 ENCOUNTER — Encounter: Payer: Self-pay | Admitting: Internal Medicine

## 2012-02-07 ENCOUNTER — Ambulatory Visit (INDEPENDENT_AMBULATORY_CARE_PROVIDER_SITE_OTHER): Payer: 59 | Admitting: Family Medicine

## 2012-02-07 VITALS — BP 104/70 | Ht 71.0 in | Wt 180.0 lb

## 2012-02-07 DIAGNOSIS — M7700 Medial epicondylitis, unspecified elbow: Secondary | ICD-10-CM

## 2012-02-07 DIAGNOSIS — M7701 Medial epicondylitis, right elbow: Secondary | ICD-10-CM

## 2012-02-07 MED ORDER — NITROGLYCERIN 0.2 MG/HR TD PT24: MEDICATED_PATCH | TRANSDERMAL | Status: DC

## 2012-02-07 MED ORDER — MELOXICAM 15 MG PO TABS: 15.0000 mg | ORAL_TABLET | Freq: Every day | ORAL | Status: DC

## 2012-02-07 NOTE — Patient Instructions (Signed)
Very nice to meet you.  We are going to try everything to get this better.  Meloxicam daily Nitro patch and increase to 1/2 patch daily.  Ice 20 minutes 3 times a day Do the home exercises and we will send you to physical therapy.  Come back in 1 month and we will see how you are doing.

## 2012-02-07 NOTE — Assessment & Plan Note (Signed)
Discussed at length with patient. Patient did have ultrasound showed that it was healing or was healed completely previously. Patient though likely has reinjured his with his overuse. Patient will continue conservative therapy. Patient will be put on meloxicam to take daily for the next 30 days. Patient also had a nitroglycerin patch and increase to half patch daily. Patient will start formal physical therapy for medial epicondylitis as well. Patient can do other type of modalities will bear such as iontophoresis. Patient will followup in 4 weeks' time. If he has had significant improvement at that time we'll continue to conservative therapy. If not will consider referral to a surgeon for possible surgical intervention. If patient is known to do that we'll also consider tendinopathy by Dr. Darrick Penna here.

## 2012-02-07 NOTE — Progress Notes (Signed)
45 year old male coming in with right-sided elbow pain. Patient has had this elbow pain first quite some time.patient was first seen for this in November. Patient was put on a nitroglycerin patch back in March. Patient also had a corticosteroid injection in May of 2013. Patient states that he did have some improvement after this last injection. Patient though stopped using the nitroglycerin patch approximately one month ago and has also been increasing his activity with tenderness as well as moving recently which is seem to exacerbate the problem. Patient is becoming somewhat frustrated with this. Patient states that they're certain movements to give any radiation of pain down the forearm. Patient denies any numbness or weakness in the extremity.  Review of systems was done and Unremarkableas related to the chief complaint  Family social and past medical history was reviewed with no changes.  Physical exam Filed Vitals:   02/07/12 1408  BP: 104/70   General: No apparent distress alert and oriented x3 Mood normal Right elbow exam: Patient does have full flexion extension as well as supination and pronation. Patient does have pain when he does flexion and ulnar deviation of the wrist at the elbow. Patient is minimally tender to palpation over the medial epicondyles just inferior and posterior to this area. Patient has no numbness in the extremity. Neurovascularly intact distally in 5 out of 5 strength in the hand grip.  Ultrasound was performed and interpreted by me today. Patient does have findings of hypoechoic changes at the insertion of the flexor digitorum minimi. Patient does have some neovascularization occurring in this area as well. Patient has some mild scar tissue formation it appears right at the insertion of the flexor profundus. Patient does have an area of by calcified lesion in the same vicinity.

## 2012-02-29 ENCOUNTER — Ambulatory Visit: Payer: 59 | Admitting: Physical Therapy

## 2012-02-29 ENCOUNTER — Encounter: Payer: Self-pay | Admitting: *Deleted

## 2012-02-29 ENCOUNTER — Ambulatory Visit: Payer: 59 | Attending: Sports Medicine | Admitting: Physical Therapy

## 2012-02-29 DIAGNOSIS — IMO0001 Reserved for inherently not codable concepts without codable children: Secondary | ICD-10-CM | POA: Insufficient documentation

## 2012-02-29 DIAGNOSIS — M25629 Stiffness of unspecified elbow, not elsewhere classified: Secondary | ICD-10-CM | POA: Insufficient documentation

## 2012-02-29 DIAGNOSIS — M25539 Pain in unspecified wrist: Secondary | ICD-10-CM | POA: Insufficient documentation

## 2012-02-29 DIAGNOSIS — M7701 Medial epicondylitis, right elbow: Secondary | ICD-10-CM

## 2012-03-02 ENCOUNTER — Ambulatory Visit: Payer: 59 | Admitting: Physical Therapy

## 2012-03-06 ENCOUNTER — Ambulatory Visit: Payer: 59 | Attending: Sports Medicine | Admitting: Physical Therapy

## 2012-03-06 DIAGNOSIS — M25629 Stiffness of unspecified elbow, not elsewhere classified: Secondary | ICD-10-CM | POA: Insufficient documentation

## 2012-03-06 DIAGNOSIS — M25539 Pain in unspecified wrist: Secondary | ICD-10-CM | POA: Insufficient documentation

## 2012-03-06 DIAGNOSIS — IMO0001 Reserved for inherently not codable concepts without codable children: Secondary | ICD-10-CM | POA: Insufficient documentation

## 2012-03-08 ENCOUNTER — Ambulatory Visit: Payer: 59 | Admitting: Physical Therapy

## 2012-03-13 ENCOUNTER — Ambulatory Visit: Payer: 59 | Admitting: Physical Therapy

## 2012-03-16 ENCOUNTER — Ambulatory Visit: Payer: 59 | Admitting: Physical Therapy

## 2012-03-19 ENCOUNTER — Ambulatory Visit: Payer: 59 | Admitting: Physical Therapy

## 2012-03-20 ENCOUNTER — Encounter: Payer: Self-pay | Admitting: Sports Medicine

## 2012-03-20 ENCOUNTER — Ambulatory Visit (INDEPENDENT_AMBULATORY_CARE_PROVIDER_SITE_OTHER): Payer: 59 | Admitting: Sports Medicine

## 2012-03-20 VITALS — BP 119/76 | HR 53

## 2012-03-20 DIAGNOSIS — M7701 Medial epicondylitis, right elbow: Secondary | ICD-10-CM

## 2012-03-20 DIAGNOSIS — M7700 Medial epicondylitis, unspecified elbow: Secondary | ICD-10-CM

## 2012-03-20 NOTE — Assessment & Plan Note (Signed)
Seems to be a chronic condition at this point  I think his risk of tendon rupture is low  He is cleared to play tennis to his level of tolerance but we'll have to change his biomechanics  Recheck with [place] as needed

## 2012-03-20 NOTE — Progress Notes (Signed)
  Subjective:    Patient ID: [name], male    DOB: 09-11-1965, 46 y.o.   MRN: [id number]  HPI [name] is here today to follow up right elbow pain.  At his last appointment he was referred to PT.  This was initially going well, but then the therapist did some deep tissue massage to break up scar tissue and since then he had increased pain and significant swelling which gradually improved.  Has cut back on his home exercises and tennis due to concerns about the swelling.  Feels that he couldn't play tennis but would have pain that would probably go to a 4 of 10 level He is not really limited in other daily activities  For tennis the primary painful shots are the serves and overhead  Review of Systems     Objective:   Physical Exam Gen: alert, cooperative, NAD Right elbow: no erythema, trace swelling along pronator muscle, full extension and flexion with mild pain on extreme of flexion, 5/5 strength in wrist extension and wrist flexion but does have pain with flexion of fourth and fifth fingers against resistance  [facility] [place] right elbow: small hypoechoic area remains in the flexor digitorum minimus tendon There is some increased Doppler flow still noted. The remainder of the flexor muscles look normal and he has good muscle mass The areas of prior calcification are smaller      Assessment & Plan:  Medial epicondylitis: Appears stable to mildly improved on ultrasound.  Continues to have small tear in the flexor digitorum minimus.  Will have him stop physical therapy, but continue home exercises.  Okay to continue nitroglycerin patch.  Would likely benefit from some biomechanical adjustments to his serve to help decrease pain.  Low risk for tendon rupture at this point.  Okay for gradual return to full activity, will likely continue to have some chronic medial epicondyle pain.

## 2012-03-21 ENCOUNTER — Encounter: Payer: 59 | Admitting: Physical Therapy

## 2012-03-28 ENCOUNTER — Encounter: Payer: 59 | Admitting: Physical Therapy

## 2012-03-30 ENCOUNTER — Encounter: Payer: 59 | Admitting: Physical Therapy

## 2012-05-31 ENCOUNTER — Encounter: Payer: Self-pay | Admitting: Internal Medicine

## 2012-05-31 ENCOUNTER — Ambulatory Visit (INDEPENDENT_AMBULATORY_CARE_PROVIDER_SITE_OTHER): Payer: 59 | Admitting: Internal Medicine

## 2012-05-31 VITALS — BP 120/74 | HR 74 | Ht 71.0 in | Wt 200.4 lb

## 2012-05-31 DIAGNOSIS — J209 Acute bronchitis, unspecified: Secondary | ICD-10-CM

## 2012-05-31 DIAGNOSIS — J45901 Unspecified asthma with (acute) exacerbation: Secondary | ICD-10-CM

## 2012-05-31 DIAGNOSIS — J45909 Unspecified asthma, uncomplicated: Secondary | ICD-10-CM

## 2012-05-31 MED ORDER — ALBUTEROL SULFATE HFA 108 (90 BASE) MCG/ACT IN AERS: INHALATION_SPRAY | RESPIRATORY_TRACT | Status: DC

## 2012-05-31 MED ORDER — AZITHROMYCIN 250 MG PO TABS: ORAL_TABLET | ORAL | Status: DC

## 2012-05-31 NOTE — Progress Notes (Signed)
08/19/11- 45 yoM never smoker followed for allergic rhinitis, hx allergic asthma, hx recurrent bronchopneumonia. LOV- doing well off of vaccine for the past year. No asthma. Had a cold a few weeks ago but shook it off. 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 FVC 5.69/109%, FEV1 3.38/81%, FEV1/FEC 0.59, FEF 25-75% 1.98/48%.  05/31/12- 46 yoM never smoker followed for allergic rhinitis, hx allergic asthma, hx recurrent bronchopneumonia. ACUTE VISIT: started yesterday-sore and tight chest, cough-productive-brown in color; denies any wheezing or SOB; fever of 48F this morning and was given tylenol and mucinex 2-3 days of chest rattle, green/brown sputum, temp 99. Trying otc's. Maxair inhaler helped today. Using Advair 100 once daily.  About to leave on a cruise.  ROS-see HPI Constitutional:   No-   weight loss, night sweats, fevers, chills, fatigue, lassitude. HEENT:   No-  headaches, difficulty swallowing, tooth/dental problems, sore throat,       No-  sneezing, itching, ear ache, +nasal congestion, post nasal drip,  CV:  No-   chest pain, orthopnea, PND, swelling in lower extremities, anasarca, dizziness, palpitations Resp: No-   shortness of breath with exertion or at rest.              + productive cough,  No non-productive cough,  No- coughing up of blood.              +  change in color of mucus.  + wheezing.   Skin: No-   rash or lesions. GI:  No-   heartburn, indigestion, abdominal pain, nausea, vomiting GU: . MS:  No-   joint pain or swelling.  Neuro-     nothing unusual Psych:  No- change in mood or affect. No depression or anxiety.  No memory loss.  OBJ- Physical Exam General- Alert, Oriented, Affect-appropriate, Distress- none acute Skin- rash-none, lesions- none, excoriation- none Lymphadenopathy- none Head- atraumatic            Eyes- Gross vision intact, PERRLA, conjunctivae and secretions clear            Ears- Hearing, canals-normal            Nose-  +mucus bridging, no-Septal dev,  polyps, erosion, perforation             Throat- Mallampati II , mucosa clear , drainage- none, tonsils- atrophic Neck- flexible , trachea midline, no stridor , thyroid nl, carotid no bruit Chest - symmetrical excursion , unlabored           Heart/CV- RRR , no murmur , no gallop  , no rub, nl s1 s2                           - JVD- none , edema- none, stasis changes- none, varices- none           Lung- clear to P&A, wheeze- none, cough+ with deep breath, dullness-none, rub- none           Chest wall-  Abd-  Br/ Gen/ Rectal- Not done, not indicated Extrem- cyanosis- none, clubbing, none, atrophy- none, strength- nl Neuro- grossly intact to observation

## 2012-05-31 NOTE — Patient Instructions (Addendum)
Script for Z pak sent  Script for albuterol HFA rescue inhaler sent  Suggest Advair twice daily till your chest is clearer, then resume once daily  Please call as needed

## 2012-06-10 NOTE — Assessment & Plan Note (Signed)
Initially a viral URI/ bronchitis. Want to be a little more aggressive since he will be oot on a cruise.  Plan Script for rescue inhaler. Use Advair twice daily. Add Z pak. Fluids etc.

## 2012-07-20 ENCOUNTER — Other Ambulatory Visit: Payer: Self-pay | Admitting: Internal Medicine

## 2012-09-04 ENCOUNTER — Telehealth: Payer: Self-pay | Admitting: *Deleted

## 2012-09-04 NOTE — Telephone Encounter (Signed)
Left message for call back to schedule appointment with Dr.McLean.

## 2012-09-06 NOTE — Telephone Encounter (Signed)
Apt. Scheduled for 4/8.

## 2012-09-11 ENCOUNTER — Ambulatory Visit (INDEPENDENT_AMBULATORY_CARE_PROVIDER_SITE_OTHER): Payer: 59 | Admitting: Cardiology

## 2012-09-11 ENCOUNTER — Encounter: Payer: Self-pay | Admitting: Cardiology

## 2012-09-11 ENCOUNTER — Ambulatory Visit (INDEPENDENT_AMBULATORY_CARE_PROVIDER_SITE_OTHER)
Admission: RE | Admit: 2012-09-11 | Discharge: 2012-09-11 | Disposition: A | Discharge status: Home / Self Care | Payer: 59 | Source: Ambulatory Visit | Attending: Cardiology | Admitting: Cardiology

## 2012-09-11 VITALS — BP 116/74 | HR 71 | Ht 71.0 in | Wt 193.0 lb

## 2012-09-11 DIAGNOSIS — R079 Chest pain, unspecified: Secondary | ICD-10-CM

## 2012-09-11 DIAGNOSIS — E785 Hyperlipidemia, unspecified: Secondary | ICD-10-CM

## 2012-09-11 MED ORDER — ESCITALOPRAM OXALATE 20 MG PO TABS: 20.0000 mg | ORAL_TABLET | Freq: Every day | ORAL | Status: DC

## 2012-09-11 NOTE — Patient Instructions (Addendum)
Your physician recommends that you return for lab work today--NMR lipomed profile.  You have been scheduled for a CT coronary artery calcium score today.   Your physician recommends that you schedule a follow-up appointment as needed with Dr Shirlee Latch.

## 2012-09-11 NOTE — Progress Notes (Signed)
Patient ID: [name], male   DOB: 1966/05/12, 47 y.o.   MRN: [id number] PCP: Will be Sanda Linger  47 yo with history of asthma and hyperlipidemia presents for evaluation of chest pain, cardiac risk factors.  Mr Gombert has a history of occasional squeezing central chest pain that is associated with anxiety/stress.  It is rare (once every few months).  He has no exertional chest pain or dyspnea.  He has excellent exercise tolerance.  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  He has occasional flares of asthma but in general is well-controlled on his meds.  He tolerates simvastatin without muscle pain.  No hypertension, never smoked, does not have a family history of premature CAD.   ECG: NSR, normal  Labs (2011): HDL 36, LDL 79, TGs 39  PMH: 1. Allergic rhinitis 2. Allergic asthma: mild obstruction on prior spirometry.  Follows with Dr. Maple Hudson.   3. Medial epicondylitis on right. 4. Depression/anxiety 5. Hyperlipidemia.  SH: Nonsmoker, married, 2 daughters by first marriage, sells AEDs, former Radio producer.   FH: Grandfather with MI in his late 45s, father with hyperlipidemia.   ROS: All systems reviewed and negative except as per HPI.   Current Outpatient Prescriptions  Medication Sig Dispense Refill  . escitalopram (LEXAPRO) 20 MG tablet Take 1 tablet (20 mg total) by mouth daily.  90 tablet  0  . FINASTERIDE PO Take 0.25 mg by mouth daily.      . Fluticasone-Salmeterol (ADVAIR) 100-50 MCG/DOSE AEPB Inhale 1 puff into the lungs daily.      . montelukast (SINGULAIR) 10 MG tablet TAKE 1 TABLET AT BEDTIME  90 tablet  PRN  . simvastatin (ZOCOR) 20 MG tablet Take 20 mg by mouth daily.       No current facility-administered medications for this visit.    BP 116/74  Pulse 71  Ht 5\' 11"  (1.803 m)  Wt 193 lb (87.544 kg)  BMI 26.93 kg/m2  SpO2 99% General: NAD Neck: No JVD, no thyromegaly or thyroid nodule.   Lungs: Clear to auscultation bilaterally with normal respiratory effort. CV: Nondisplaced PMI.  Heart regular S1/S2, no S3/S4, no murmur.  No peripheral edema.  No carotid bruit.  Normal pedal pulses.  Abdomen: Soft, nontender, no hepatosplenomegaly, no distention.  Skin: Intact without lesions or rashes.  Neurologic: Alert and oriented x 3.  Psych: Normal affect. Extremities: No clubbing or cyanosis.  HEENT: Normal.   Assessment/Plan: 1. Chest pain.  I suspect that this is noncardiac and related to anxiety.  He exercises very heavily without any exertional symptoms.  I will get a coronary artery calcium score to risk stratify him.  If calcium is detected, I would suggest that he take aspirin.  2. Hyperlipidemia: Continue statin, will get a Lipomed profile to follow lipids.    Marca Ancona 09/11/2012

## 2012-09-12 LAB — NMR LIPOPROFILE WITH LIPIDS
HDL Particle Number: 28.6 umol/L — ABNORMAL LOW (ref >=30.5)
HDL Size: 8.9 nm — ABNORMAL LOW (ref >=9.2)
HDL-C: 37 mg/dL — ABNORMAL LOW (ref >=40)
Large HDL-P: 2.3 umol/L — ABNORMAL LOW (ref >=4.8)
Large VLDL-P: 1.8 nmol/L (ref <=2.7)
Triglycerides: 59 mg/dL (ref <150)

## 2012-09-27 ENCOUNTER — Telehealth: Payer: Self-pay | Admitting: Internal Medicine

## 2012-09-27 MED ORDER — FLUTICASONE-SALMETEROL 100-50 MCG/DOSE IN AEPB: 1.0000 | INHALATION_SPRAY | Freq: Two times a day (BID) | RESPIRATORY_TRACT | Status: DC

## 2012-09-27 NOTE — Telephone Encounter (Signed)
Last ov 05-2012. I spoke with the pt to clarify advair directions because med list states the pt is taking 1 puff once daily. The pt actually states that he is taking 2 puffs twice daily of advair 100-50 because this is the dose that best controls his breathing. . I advised the pt that I will need to speak with Dr. Annamaria Boots before we can send in rx because this does not match his med list and it is a higher dose then usual.  Pt states understanding. Dr. Annamaria Boots please advise if it is ok to send rx for advair 100-50 with directions 2 puffs twice daily?? Allergies  Allergen Reactions  . Minocycline Rash    Erythema Multiforme

## 2012-09-27 NOTE — Telephone Encounter (Signed)
Per CY-okay but 2 puffs BID will require 2 units a month and insurance may not cover for this. May need to use Advair 250/50 1 puff BID.

## 2012-09-27 NOTE — Telephone Encounter (Signed)
Patient also requesting sample of advair until rx has time to be mailed to him.

## 2012-09-27 NOTE — Telephone Encounter (Signed)
Spoke to pt. He states that he is only using Advair 100/50 1 puff BID, not 2 puff BID.  Refill has been sent in for Advair 100/50 1 puff BID.

## 2012-10-01 ENCOUNTER — Telehealth: Payer: Self-pay | Admitting: *Deleted

## 2012-10-01 ENCOUNTER — Other Ambulatory Visit: Payer: Self-pay | Admitting: *Deleted

## 2012-10-01 DIAGNOSIS — E785 Hyperlipidemia, unspecified: Secondary | ICD-10-CM

## 2012-10-01 MED ORDER — ATORVASTATIN CALCIUM 40 MG PO TABS: 40.0000 mg | ORAL_TABLET | Freq: Every day | ORAL | Status: DC

## 2012-10-01 NOTE — Telephone Encounter (Signed)
[name], MD ','<<< Less Detail       [name], MD  [name]     MRN: [id number] DOB: 25-Nov-1965     Pt Work: [phone] Pt Home: [phone]   Sent: Wynelle Link September 30, 2012 8:42 PM     To: Jacqlyn Krauss, RN                          Message       Please call his prescription for atorvastatin 40 into [facility] and let him know when it is put in.   Thanks.   [place]

## 2013-02-05 ENCOUNTER — Encounter: Payer: Self-pay | Admitting: Family Medicine

## 2013-02-05 ENCOUNTER — Ambulatory Visit (INDEPENDENT_AMBULATORY_CARE_PROVIDER_SITE_OTHER): Payer: 59 | Admitting: Family Medicine

## 2013-02-05 ENCOUNTER — Ambulatory Visit: Payer: 59 | Admitting: Family Medicine

## 2013-02-05 VITALS — BP 120/77 | HR 49 | Ht 71.0 in | Wt 193.0 lb

## 2013-02-05 DIAGNOSIS — M7701 Medial epicondylitis, right elbow: Secondary | ICD-10-CM

## 2013-02-05 DIAGNOSIS — M7700 Medial epicondylitis, unspecified elbow: Secondary | ICD-10-CM

## 2013-02-05 NOTE — Progress Notes (Signed)
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 Patient states that unfortunately his pain has worsened at this point and not only affects him when playing tennis but also decreases his quality of life by inhibiting his sleep and causing him pain when he is playing with his children. He continues to play tennis 4-5 days per week competitively. He has tried multiple things in the past including physical therapy, nitroglycerin patch, corticosteroid injection with needling, massage, eccentric exercises, and equipment adjustments including decreasing his strength tension and increasing his grip size. This all started about 2 years ago when he had an initial injury while serving. At that time he had bruising in his forearm and had an MRI scan of his although that showed a small tear in the flexor tendon. Unfortunately since that time despite aggressive conservative therapy has not had any improvement.  ROS: As above in the HPI. All other systems are stable or negative.  OBJECTIVE: APPEARANCE:  Patient in no acute distress.The patient appeared well nourished and normally developed. HEENT: No scleral icterus. Conjunctiva non-injected Resp: Non labored Skin: No rash MSK:  Right elbow: There is swelling apparent at the right medial elbow. Patient has full range of motion in flexion and extension as well as pronation and supination. He has 5 out of 5 strength in flexion, extension, supination, pronation, wrist flexion extension, grip strength. He has pain with resisted wrist flexion and resisted pronation. He is tender to palpation over the right medial epicondyle. Negative ulnar percussion.  MSK [place]: limited ultrasound of the right medial elbow was performed in transverse and longitudinal views. There was noted to be increased hypoechoic change and hyperechoic calcification along the medial epicondyle  in both transverse and longitudinal views. There was decreased tendon fiber organization with apparent disarray. No obvious tear. Ulnar nerve normal in appearance in both transverse and longitudinal views with no subluxation on elbow flexion and extension.  ASSESSMENT:  #1. Right medial epicondylitis, refractory to extensive conservative measures.  PLAN: I discussed multiple treatment options with Mr. Schlafer. We did offer a second trial of noninvasive intervention such as nitroglycerin or home exercise plan. However, since he has continued worsening symptoms despite aggressive conservative interventions as well as worsening pain and now interrupts his sleep and activities with his family, we do think that he would benefit from a surgical opinion. Recommended that he discuss surgical options and likelihood of a successful outcome with an upper extremity surgeon. We did defer further imaging or MRI at this time so that the surgeon made order the appropriate tests and protocol if desired. We recommended that Mr. Deren discuss surgical protocol such as debridement versus tendon repair as well as likelihood of positive out, and postoperative recovery time. We would be happy to see him back in the future for another trial of conservative therapy should he decide that surgery is not the appropriate intervention for him.

## 2013-02-05 NOTE — Patient Instructions (Signed)
Thank you for coming in today  We would recommend that you obtain surgical consultation with an upper extremity surgeon. We would be happy to see you back any time in the future if you would like to try conservative treatments in the future

## 2013-03-04 ENCOUNTER — Other Ambulatory Visit: Payer: Self-pay | Admitting: *Deleted

## 2013-03-04 MED ORDER — ESCITALOPRAM OXALATE 20 MG PO TABS: 20.0000 mg | ORAL_TABLET | Freq: Every day | ORAL | Status: DC

## 2013-03-25 ENCOUNTER — Ambulatory Visit (INDEPENDENT_AMBULATORY_CARE_PROVIDER_SITE_OTHER): Payer: 59 | Admitting: Internal Medicine

## 2013-03-25 ENCOUNTER — Telehealth: Payer: Self-pay

## 2013-03-25 ENCOUNTER — Encounter: Payer: Self-pay | Admitting: Internal Medicine

## 2013-03-25 ENCOUNTER — Other Ambulatory Visit (INDEPENDENT_AMBULATORY_CARE_PROVIDER_SITE_OTHER): Payer: 59

## 2013-03-25 VITALS — BP 134/84 | HR 64 | Temp 96.6°F | Ht 71.0 in | Wt 188.0 lb

## 2013-03-25 DIAGNOSIS — J45909 Unspecified asthma, uncomplicated: Secondary | ICD-10-CM

## 2013-03-25 DIAGNOSIS — Z23 Encounter for immunization: Secondary | ICD-10-CM

## 2013-03-25 DIAGNOSIS — J309 Allergic rhinitis, unspecified: Secondary | ICD-10-CM

## 2013-03-25 DIAGNOSIS — J4520 Mild intermittent asthma, uncomplicated: Secondary | ICD-10-CM

## 2013-03-25 DIAGNOSIS — E785 Hyperlipidemia, unspecified: Secondary | ICD-10-CM

## 2013-03-25 DIAGNOSIS — Z130 Encounter for screening for diseases of the blood and blood-forming organs and certain disorders involving the immune mechanism: Secondary | ICD-10-CM

## 2013-03-25 DIAGNOSIS — Z0000 Encounter for general adult medical examination without abnormal findings: Secondary | ICD-10-CM

## 2013-03-25 DIAGNOSIS — F329 Major depressive disorder, single episode, unspecified: Secondary | ICD-10-CM

## 2013-03-25 DIAGNOSIS — F32A Depression, unspecified: Secondary | ICD-10-CM

## 2013-03-25 LAB — COMPREHENSIVE METABOLIC PANEL
ALT: 31 U/L (ref 0–53)
AST: 24 U/L (ref 0–37)
Alkaline Phosphatase: 48 U/L (ref 39–117)
Calcium: 9.5 mg/dL (ref 8.4–10.5)
Chloride: 105 mEq/L (ref 96–112)
Creatinine, Ser: 0.9 mg/dL (ref 0.4–1.5)
Total Bilirubin: 1.1 mg/dL (ref 0.3–1.2)
Total Protein: 7 g/dL (ref 6.0–8.3)

## 2013-03-25 LAB — LIPID PANEL
HDL: 33.7 mg/dL — ABNORMAL LOW (ref >39.00)
LDL Cholesterol: 115 mg/dL — ABNORMAL HIGH (ref 0–99)
Total CHOL/HDL Ratio: 5
Triglycerides: 108 mg/dL (ref 0.0–149.0)
VLDL: 21.6 mg/dL (ref 0.0–40.0)

## 2013-03-25 LAB — CBC
HCT: 48.5 % (ref 39.0–52.0)
Hemoglobin: 16.8 g/dL (ref 13.0–17.0)
MCHC: 34.6 g/dL (ref 30.0–36.0)
RDW: 12.8 % (ref 11.5–14.6)

## 2013-03-25 MED ORDER — ESCITALOPRAM OXALATE 20 MG PO TABS: 20.0000 mg | ORAL_TABLET | Freq: Every day | ORAL | Status: DC

## 2013-03-25 MED ORDER — MONTELUKAST SODIUM 10 MG PO TABS: ORAL_TABLET | ORAL | Status: DC

## 2013-03-25 MED ORDER — FLUTICASONE-SALMETEROL 100-50 MCG/DOSE IN AEPB: 1.0000 | INHALATION_SPRAY | Freq: Two times a day (BID) | RESPIRATORY_TRACT | Status: DC

## 2013-03-25 NOTE — Addendum Note (Signed)
Addended by: Bethann Punches E on: 03/25/2013 10:26 AM   Modules accepted: Orders

## 2013-03-25 NOTE — Patient Instructions (Signed)
Health Maintenance, Males A healthy lifestyle and preventative care can promote health and wellness.  Maintain regular health, dental, and eye exams.  Eat a healthy diet. Foods like vegetables, fruits, whole grains, low-fat dairy products, and lean protein foods contain the nutrients you need without too many calories. Decrease your intake of foods high in solid fats, added sugars, and salt. Get information about a proper diet from your caregiver, if necessary.  Regular physical exercise is one of the most important things you can do for your health. Most adults should get at least 150 minutes of moderate-intensity exercise (any activity that increases your heart rate and causes you to sweat) each week. In addition, most adults need muscle-strengthening exercises on 2 or more days a week.   Maintain a healthy weight. The body mass index (BMI) is a screening tool to identify possible weight problems. It provides an estimate of body fat based on height and weight. Your caregiver can help determine your BMI, and can help you achieve or maintain a healthy weight. For adults 20 years and older:  A BMI below 18.5 is considered underweight.  A BMI of 18.5 to 24.9 is normal.  A BMI of 25 to 29.9 is considered overweight.  A BMI of 30 and above is considered obese.  Maintain normal blood lipids and cholesterol by exercising and minimizing your intake of saturated fat. Eat a balanced diet with plenty of fruits and vegetables. Blood tests for lipids and cholesterol should begin at age 20 and be repeated every 5 years. If your lipid or cholesterol levels are high, you are over 50, or you are a high risk for heart disease, you may need your cholesterol levels checked more frequently.Ongoing high lipid and cholesterol levels should be treated with medicines, if diet and exercise are not effective.  If you smoke, find out from your caregiver how to quit. If you do not use tobacco, do not start.  If you  choose to drink alcohol, do not exceed 2 drinks per day. One drink is considered to be 12 ounces (355 mL) of beer, 5 ounces (148 mL) of wine, or 1.5 ounces (44 mL) of liquor.  Avoid use of street drugs. Do not share needles with anyone. Ask for help if you need support or instructions about stopping the use of drugs.  High blood pressure causes heart disease and increases the risk of stroke. Blood pressure should be checked at least every 1 to 2 years. Ongoing high blood pressure should be treated with medicines if weight loss and exercise are not effective.  If you are 45 to 47 years old, ask your caregiver if you should take aspirin to prevent heart disease.  Diabetes screening involves taking a blood sample to check your fasting blood sugar level. This should be done once every 3 years, after age 45, if you are within normal weight and without risk factors for diabetes. Testing should be considered at a younger age or be carried out more frequently if you are overweight and have at least 1 risk factor for diabetes.  Colorectal cancer can be detected and often prevented. Most routine colorectal cancer screening begins at the age of 50 and continues through age 75. However, your caregiver may recommend screening at an earlier age if you have risk factors for colon cancer. On a yearly basis, your caregiver may provide home test kits to check for hidden blood in the stool. Use of a small camera at the end of a tube,   to directly examine the colon (sigmoidoscopy or colonoscopy), can detect the earliest forms of colorectal cancer. Talk to your caregiver about this at age 50, when routine screening begins. Direct examination of the colon should be repeated every 5 to 10 years through age 75, unless early forms of pre-cancerous polyps or small growths are found.  Hepatitis C blood testing is recommended for all people born from 1945 through 1965 and any individual with known risks for hepatitis C.  Healthy  men should no longer receive prostate-specific antigen (PSA) blood tests as part of routine cancer screening. Consult with your caregiver about prostate cancer screening.  Testicular cancer screening is not recommended for adolescents or adult males who have no symptoms. Screening includes self-exam, caregiver exam, and other screening tests. Consult with your caregiver about any symptoms you have or any concerns you have about testicular cancer.  Practice safe sex. Use condoms and avoid high-risk sexual practices to reduce the spread of sexually transmitted infections (STIs).  Use sunscreen with a sun protection factor (SPF) of 30 or greater. Apply sunscreen liberally and repeatedly throughout the day. You should seek shade when your shadow is shorter than you. Protect yourself by wearing long sleeves, pants, a wide-brimmed hat, and sunglasses year round, whenever you are outdoors.  Notify your caregiver of new moles or changes in moles, especially if there is a change in shape or color. Also notify your caregiver if a mole is larger than the size of a pencil eraser.  A one-time screening for abdominal aortic aneurysm (AAA) and surgical repair of large AAAs by sound wave imaging (ultrasonography) is recommended for ages 65 to 75 years who are current or former smokers.  Stay current with your immunizations. Document Released: 11/19/2007 Document Revised: 08/15/2011 Document Reviewed: 10/18/2010 [employer] Patient Information 2014 [employer].  

## 2013-03-25 NOTE — Progress Notes (Signed)
HPI  Pt presents to the clinic today to establish care. He does not have a PCP. He sees Dr. Annamaria Boots for management of his asthma/allergies. He sees Dr. Aundra Dubin for management of his lipids. He does need some refills today. Other than that, he has no concerns.  Flu: yearly Tetanus: more than 10 years ago Pneumovax: never Eye doctor: as needed Dentist: yearly  Past Medical History  Diagnosis Date  . Asthma   . Bronchopneumonia     Recurrent  . Hyperlipidemia   . Anxiety     Current Outpatient Prescriptions  Medication Sig Dispense Refill  . atorvastatin (LIPITOR) 40 MG tablet Take 1 tablet (40 mg total) by mouth daily.  90 tablet  3  . escitalopram (LEXAPRO) 20 MG tablet Take 1 tablet (20 mg total) by mouth daily.  30 tablet  0  . FINASTERIDE PO Take 0.25 mg by mouth daily.      . Fluticasone-Salmeterol (ADVAIR) 100-50 MCG/DOSE AEPB Inhale 1 puff into the lungs 2 (two) times daily.  180 each  1  . montelukast (SINGULAIR) 10 MG tablet TAKE 1 TABLET AT BEDTIME  90 tablet  PRN   No current facility-administered medications for this visit.    Allergies  Allergen Reactions  . Minocycline Rash    Erythema Multiforme    Family History  Problem Relation Age of Onset  . Allergic rhinitis Daughter   . Hyperlipidemia Mother   . Hyperlipidemia Father   . Heart disease Maternal Grandmother   . Heart disease Maternal Grandfather     History   Social History  . Marital Status: Married    Spouse Name: N/A    Number of Children: N/A  . Years of Education: N/A   Occupational History  . 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 Smoking status: Never Smoker   . Smokeless tobacco: Never Used  . Alcohol Use: Yes  . Drug Use: No  . Sexual Activity: Not on file   Other Topics Concern  . Not on file   Social History Narrative   Father is retired IT trainer is physician    ROS:  Constitutional: Denies fever, malaise,  fatigue, headache or abrupt weight changes.  HEENT: Denies eye pain, eye redness, ear pain, ringing in the ears, wax buildup, runny nose, nasal congestion, bloody nose, or sore throat. Respiratory: Denies difficulty breathing, shortness of breath, cough or sputum production.   Cardiovascular: Denies chest pain, chest tightness, palpitations or swelling in the hands or feet.  Gastrointestinal: Denies abdominal pain, bloating, constipation, diarrhea or blood in the stool.  GU: Denies frequency, urgency, pain with urination, blood in urine, odor or discharge. Musculoskeletal: Pt reports right elbow pain. Denies decrease in range of motion, difficulty with gait, muscle pain or joint swelling.  Skin: Denies redness, rashes, lesions or ulcercations.  Neurological: Denies dizziness, difficulty with memory, difficulty with speech or problems with balance and coordination.   No other specific complaints in a complete review of systems (except as listed in HPI above).  PE:  BP 134/84  Pulse 64  Temp(Src) 96.6 F (35.9 C) (Oral)  Ht 5\' 11"  (1.803 m)  Wt 188 lb (85.276 kg)  BMI 26.23 kg/m2  SpO2 93% Wt Readings from Last 3 Encounters:  03/25/13 188 lb (85.276 kg)  02/05/13 193 lb (87.544 kg)  09/11/12 193 lb (87.544 kg)    General: Appears his stated age, well developed, well nourished in  NAD. HEENT: Head: normal shape and size; Eyes: sclera white, no icterus, conjunctiva pink, PERRLA and EOMs intact; Ears: Tm's gray and intact, normal light reflex; Nose: mucosa pink and moist, septum midline; Throat/Mouth: Teeth present, mucosa pink and moist, no lesions or ulcerations noted.  Neck: Normal range of motion. Neck supple, trachea midline. No massses, lumps or thyromegaly present.  Cardiovascular: Normal rate and rhythm. S1,S2 noted.  No murmur, rubs or gallops noted. No JVD or BLE edema. No carotid bruits noted. Pulmonary/Chest: Normal effort and positive vesicular breath sounds. No respiratory  distress. No wheezes, rales or ronchi noted.  Abdomen: Soft and nontender. Normal bowel sounds, no bruits noted. No distention or masses noted. Liver, spleen and kidneys non palpable. Musculoskeletal: Normal range of motion. No signs of joint swelling. No difficulty with gait.  Neurological: Alert and oriented. Cranial nerves II-XII intact. Coordination normal. +DTRs bilaterally. Psychiatric: Mood and affect normal. Behavior is normal. Judgment and thought content normal.    Lipid Panel     Component Value Date/Time   TRIG 59 09/11/2012 0939   LDLCALC 102* 09/11/2012 0939      Assessment and Plan:  Preventative Health Maintenance:  Flu and Tdap given today Will plan to given pneumovax at next scheduled visit Encouraged pt to visit eye doctor at least yearly Will obtain basic screening labs  RTC in 1 year or sooner if needed

## 2013-03-25 NOTE — Telephone Encounter (Signed)
Patient was unavailable wife answered phone and she was informed of labs. Wife was on the patients emergency contact list.

## 2013-03-25 NOTE — Telephone Encounter (Signed)
Message copied by Eulis Manly on Mon Mar 25, 2013  4:27 PM ------      Message from: Lorre Munroe      Created: Mon Mar 25, 2013 10:52 AM       Please call pt and let him know his labs look good. His liver and kidneys are normal. ------

## 2013-03-30 ENCOUNTER — Other Ambulatory Visit: Payer: Self-pay | Admitting: Internal Medicine

## 2013-07-09 ENCOUNTER — Telehealth: Payer: Self-pay | Admitting: Internal Medicine

## 2013-07-09 NOTE — Telephone Encounter (Signed)
LAst OV 05-2012. I spoke with the pt and advised that he needs OV. Pt sis not want to set appt at this time. I also advised no samples available. Woodford Bing, CMA

## 2013-10-14 ENCOUNTER — Encounter: Payer: Self-pay | Admitting: Sports Medicine

## 2013-10-14 ENCOUNTER — Ambulatory Visit (INDEPENDENT_AMBULATORY_CARE_PROVIDER_SITE_OTHER): Payer: 59 | Admitting: Sports Medicine

## 2013-10-14 VITALS — BP 115/80 | Ht 71.0 in | Wt 185.0 lb

## 2013-10-14 DIAGNOSIS — M79609 Pain in unspecified limb: Secondary | ICD-10-CM

## 2013-10-14 DIAGNOSIS — M722 Plantar fascial fibromatosis: Secondary | ICD-10-CM

## 2013-10-14 DIAGNOSIS — M79672 Pain in left foot: Secondary | ICD-10-CM

## 2013-10-14 MED ORDER — NITROGLYCERIN 0.2 MG/HR TD PT24: MEDICATED_PATCH | TRANSDERMAL | Status: DC

## 2013-10-14 NOTE — Progress Notes (Signed)
   Subjective:    Patient ID: [name], male    DOB: [birth year], 48 y.o.   MRN: [id number]  HPI chief complaint: Left heel pain  48 year old male comes in today complaining of an insidious onset of left heel pain. Symptoms began back in January. He describes an aching discomfort along the plantar aspect of his heel. It is worse at the end of the day and after activity. 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 He has been evaluated by a local podiatrist with x-rays. He was also given a cortisone injection which was temporarily beneficial. Custom orthotics were also constructed for him by the podiatrist. He denies any numbness or tingling in his foot or ankle. Denies any pain elsewhere in his foot. He does have a history of problems with his Achilles tendon and hamstring tendon on the same side of the past. He has been working with a local physical therapist on aggressive plantar fascial stretching and Achilles stretching. He is also tried some dry needling which he is found to be minimally beneficial. He's taking when necessary anti-inflammatories. Denies any problems with his heel in the past. He has been treated in the past with topical nitroglycerin and over the past couple days has started to use a quarter patch over his heel.  In her medical history reviewed Current medications reviewed Allergies reviewed    Review of Systems     Objective:   Physical Exam Well-developed, fit-appearing. No acute distress. Awake alert and oriented x3  Left foot: Cavus foot. Negative calcaneal squeeze. Tenderness to palpation directly at the insertion of the medial aspect of the plantar fascia at the calcaneus. Negative Tinel's over the tarsal tunnel. Neurovascularly intact distally. Walking with a slight limp.  MSK ultrasound of the left heel: Limited images were obtained. Patient has thickening of the plantar fascia. It measures 0.70cm. there also diffuse  hypoechoic changes seen within the proximal plantar fascia. Findings are consistent with plantar fasciitis.       Assessment & Plan:  1. Left heel pain secondary to plantar fasciitis  I had a long talk with the patient about this diagnosis and its recalcitrant nature. He already has custom orthotics constructed by a local podiatrist. He has also been using a pair of off-the-shelf orthotics which sound like they have a little more cushioning. I've given him a pair of green sports insoles with scaphoid pads for him to experiment with. I think he should continue working with Vivi Ferns and continue with dry needling. He will start daily icing with an ice bath. He will continue with his nitroglycerin patches using one quarter patch daily. I think he is okay to continue with activity as tolerated. Followup with me in 4 weeks for repeat clinical exam and repeat ultrasound. Call with questions or concerns in the interim.

## 2013-10-21 ENCOUNTER — Other Ambulatory Visit: Payer: Self-pay | Admitting: *Deleted

## 2013-10-21 DIAGNOSIS — E785 Hyperlipidemia, unspecified: Secondary | ICD-10-CM

## 2013-10-21 MED ORDER — ATORVASTATIN CALCIUM 40 MG PO TABS: 40.0000 mg | ORAL_TABLET | Freq: Every day | ORAL | Status: DC

## 2013-10-21 NOTE — Progress Notes (Signed)
Per Dr Aundra Dubin pt needed refills on Atorvastatin 40 mg sent to pharmacy of choice.  Sent those meds in.  Also pt is to have lipid profile done and LFT done.  Placed order into epic and [facility] for pt to have this lab appointment scheduled. Will forward this message to Dr Oleh Genin nurse to follow-up on.

## 2013-10-22 ENCOUNTER — Other Ambulatory Visit (HOSPITAL_COMMUNITY): Payer: Self-pay | Admitting: Orthopedic Surgery

## 2013-10-22 DIAGNOSIS — M25571 Pain in right ankle and joints of right foot: Secondary | ICD-10-CM

## 2013-10-23 ENCOUNTER — Other Ambulatory Visit (HOSPITAL_COMMUNITY): Payer: Self-pay | Admitting: Orthopedic Surgery

## 2013-10-23 DIAGNOSIS — M25572 Pain in left ankle and joints of left foot: Secondary | ICD-10-CM

## 2013-10-25 ENCOUNTER — Ambulatory Visit (INDEPENDENT_AMBULATORY_CARE_PROVIDER_SITE_OTHER): Payer: 59 | Admitting: Family Medicine

## 2013-10-25 ENCOUNTER — Encounter: Payer: Self-pay | Admitting: Family Medicine

## 2013-10-25 ENCOUNTER — Other Ambulatory Visit (INDEPENDENT_AMBULATORY_CARE_PROVIDER_SITE_OTHER): Payer: 59

## 2013-10-25 VITALS — BP 119/82 | Ht 71.0 in | Wt 185.0 lb

## 2013-10-25 DIAGNOSIS — E785 Hyperlipidemia, unspecified: Secondary | ICD-10-CM

## 2013-10-25 DIAGNOSIS — M722 Plantar fascial fibromatosis: Secondary | ICD-10-CM

## 2013-10-25 LAB — LIPID PANEL
CHOLESTEROL: 104 mg/dL (ref 0–200)
HDL: 32.9 mg/dL — AB (ref >39.00)
LDL Cholesterol: 60 mg/dL (ref 0–99)
Total CHOL/HDL Ratio: 3
Triglycerides: 56 mg/dL (ref 0.0–149.0)
VLDL: 11.2 mg/dL (ref 0.0–40.0)

## 2013-10-25 LAB — HEPATIC FUNCTION PANEL
ALBUMIN: 3.8 g/dL (ref 3.5–5.2)
ALT: 33 U/L (ref 0–53)
AST: 25 U/L (ref 0–37)
Alkaline Phosphatase: 49 U/L (ref 39–117)
Bilirubin, Direct: 0.1 mg/dL (ref 0.0–0.3)
TOTAL PROTEIN: 6.5 g/dL (ref 6.0–8.3)
Total Bilirubin: 1.2 mg/dL (ref 0.2–1.2)

## 2013-10-29 NOTE — Progress Notes (Signed)
   Subjective:    Patient ID: [name], male    DOB: [birth year], 48 y.o.   MRN: [id number]  HPI  Medial heel pain. Previously diagnosed with fasciitis and has been doing some stretching rehabilitation for that as well as wearing arch bands. He's also having some "dry needling" done by his physical therapist. Area pain now is on the medial heel right and of his foot more on the ankle portion.  Review of Systems No unusual foot bruising. No loss of sensation in the foot.    Objective:   Physical Exam Vital signs are reviewed and GENERAL: Well-developed male no acute distress FEET: Bilaterally he has very high arched cavus foot. Mildly tender to palpation over the origin the plantar fascia bilaterally. Mildly tender to palpation at the medial heel. ULTRASOUND: I measured his plantar fascia  today ---very similar to at last week's measurement. I did see some slight edema in the area where he is tender. This is right over the plantar nerve bundle. has a followup appointment with Dr. Micheline Chapman in 2 weeks       Assessment & Plan:  Medial heel pain We discussed and I would recommend stopping the dry needling. Also would put a small heel left under the medial side of his foot. Think it she's walking he's putting a little bit of valgus stress on the foot stretching this nerve bundle. Urinary

## 2013-10-30 ENCOUNTER — Ambulatory Visit (HOSPITAL_COMMUNITY)
Admission: RE | Admit: 2013-10-30 | Discharge: 2013-10-30 | Disposition: A | Discharge status: Home / Self Care | Payer: 59 | Source: Ambulatory Visit | Attending: Orthopedic Surgery | Admitting: Orthopedic Surgery

## 2013-10-30 DIAGNOSIS — M25572 Pain in left ankle and joints of left foot: Secondary | ICD-10-CM

## 2013-10-30 DIAGNOSIS — M722 Plantar fascial fibromatosis: Secondary | ICD-10-CM | POA: Insufficient documentation

## 2013-10-30 DIAGNOSIS — S99919A Unspecified injury of unspecified ankle, initial encounter: Principal | ICD-10-CM

## 2013-10-30 DIAGNOSIS — S99929A Unspecified injury of unspecified foot, initial encounter: Principal | ICD-10-CM

## 2013-10-30 DIAGNOSIS — M7730 Calcaneal spur, unspecified foot: Secondary | ICD-10-CM | POA: Insufficient documentation

## 2013-10-30 DIAGNOSIS — S8990XA Unspecified injury of unspecified lower leg, initial encounter: Secondary | ICD-10-CM | POA: Insufficient documentation

## 2013-10-30 DIAGNOSIS — X58XXXA Exposure to other specified factors, initial encounter: Secondary | ICD-10-CM | POA: Insufficient documentation

## 2013-11-05 ENCOUNTER — Ambulatory Visit (HOSPITAL_COMMUNITY): Payer: 59

## 2013-11-07 ENCOUNTER — Encounter: Payer: Self-pay | Admitting: Podiatry

## 2013-11-07 ENCOUNTER — Ambulatory Visit: Payer: 59 | Admitting: Podiatry

## 2013-11-07 VITALS — BP 114/70 | HR 57 | Resp 16 | Ht 71.0 in | Wt 185.0 lb

## 2013-11-07 DIAGNOSIS — M722 Plantar fascial fibromatosis: Secondary | ICD-10-CM

## 2013-11-07 NOTE — Patient Instructions (Signed)

## 2013-11-07 NOTE — Progress Notes (Signed)
   Subjective:    Patient ID: [name], male    DOB: 01-Sep-1965, 48 y.o.   MRN: [id number]  HPI Comments: "I have pain in this foot and I wanted to discuss some different treatment"  Patient states that he has pain plantar heel left that initially started in the achilles area. This has been ongoing since November 2014. He saw Dr. Geroge Baseman in Feb 2015-xrayed, cortisone shot, fitted for orthotics. He is extremely active in tennis(competitively). He picked up his orthotics in March. He has been in PT and PT recommended EPAT. He has had an MRI because of swelling in left foot. He decreased tennis play in May. He has an upcoming tournament end of June and would like to go ahead and start EPAT today, if possible.  Foot Pain      Review of Systems  Musculoskeletal: Positive for gait problem.  All other systems reviewed and are negative.      Objective:   Physical Exam        Assessment & Plan:

## 2013-11-07 NOTE — Progress Notes (Signed)
Subjective:     Patient ID: [name], male   DOB: 29-Dec-1965, 48 y.o.   MRN: [id number]  Foot Pain   patient states I then horrible pain in my heel that's been going on for about 7 months and has been treated with numerous conservative care that so far has made no difference for me. States it's very bad with activities especially tennis and he has a tournament coming up   Review of Systems  All other systems reviewed and are negative.      Objective:   Physical Exam  Nursing note and vitals reviewed. Cardiovascular: Intact distal pulses.   Musculoskeletal: Normal range of motion.  Neurological: He is alert.  Skin: Skin is warm.   neurovascular status intact with range of motion adequate and muscle strength within normal limits. Patient is found to have a fairly high arch foot type and is noted to have extreme discomfort medial fascial band left at the insertional point of the tendon into the calcaneus. Structural bunions are also noted on both feet     Assessment:     Plantar fasciitis of an acute and chronic nature left and structural deformity of both feet    Plan:     H&P and x-rays reviewed. At this point I have recommended shockwave therapy reviewed this with him and he wants to pursue this. Today I did shockwave therapy I was able to get up to 4.6 intensity 2600 shocks at 16 frequency. Dispense air fracture walker for usage after shockwave and reappoint in 1 week

## 2013-11-13 ENCOUNTER — Ambulatory Visit (INDEPENDENT_AMBULATORY_CARE_PROVIDER_SITE_OTHER): Payer: 59 | Admitting: Podiatry

## 2013-11-13 ENCOUNTER — Ambulatory Visit: Payer: Self-pay | Admitting: Podiatry

## 2013-11-13 ENCOUNTER — Encounter: Payer: Self-pay | Admitting: Podiatry

## 2013-11-13 VITALS — BP 108/65 | HR 63 | Resp 16

## 2013-11-13 DIAGNOSIS — M722 Plantar fascial fibromatosis: Secondary | ICD-10-CM

## 2013-11-14 NOTE — Progress Notes (Signed)
Subjective:     Patient ID: [name], male   DOB: July 24, 1965, 48 y.o.   MRN: [id number]  HPI patient states his heel is feeling some better   Review of Systems     Objective:   Physical Exam Neurovascular status intact with mild diminishment of discomfort upon palpation plantar heel    Assessment:     Responding well to shockwave therapy    Plan:     Shockwave initiated 5.0 on intensity 2500 shocks 16 frequency

## 2013-11-26 ENCOUNTER — Ambulatory Visit: Payer: 59 | Admitting: Sports Medicine

## 2013-11-27 ENCOUNTER — Ambulatory Visit (INDEPENDENT_AMBULATORY_CARE_PROVIDER_SITE_OTHER): Payer: 59 | Admitting: Podiatry

## 2013-11-27 DIAGNOSIS — M722 Plantar fascial fibromatosis: Secondary | ICD-10-CM

## 2013-11-28 NOTE — Progress Notes (Signed)
Subjective:     Patient ID: [name], male   DOB: 06-17-1965, 48 y.o.   MRN: [id number]  HPI patient presents for shockwave #3 stating it's gradually getting better and he played tennis 5 times last week   Review of Systems     Objective:   Physical Exam Neurovascular status intact with discomfort in the plantar heel which continues to improve    Assessment:     Plantar fasciitis improving with shockwave    Plan:     2500 shocks 5.0 intensity 16 frequency administered today

## 2014-01-01 ENCOUNTER — Ambulatory Visit (INDEPENDENT_AMBULATORY_CARE_PROVIDER_SITE_OTHER): Payer: 59 | Admitting: Podiatry

## 2014-01-01 ENCOUNTER — Encounter: Payer: Self-pay | Admitting: Podiatry

## 2014-01-01 VITALS — BP 120/75 | HR 60 | Resp 16

## 2014-01-01 DIAGNOSIS — M722 Plantar fascial fibromatosis: Secondary | ICD-10-CM

## 2014-01-07 NOTE — Progress Notes (Signed)
Subjective:     Patient ID: [name], male   DOB: 01-Nov-1965, 48 y.o.   MRN: [id number]  HPI patient presents for a pad #4 stating he is some better but they still can get sore   Review of Systems     Objective:   Physical Exam Neurovascular status unchanged with continued discomfort upon palpation    Assessment:     Plantar fasciitis still present but improved    Plan:     2500 shocks 5.0 at 16 frequency

## 2014-03-05 ENCOUNTER — Ambulatory Visit (INDEPENDENT_AMBULATORY_CARE_PROVIDER_SITE_OTHER): Payer: 59 | Admitting: Podiatry

## 2014-03-05 DIAGNOSIS — M7750 Other enthesopathy of unspecified foot: Secondary | ICD-10-CM

## 2014-03-05 MED ORDER — TRIAMCINOLONE ACETONIDE 10 MG/ML IJ SUSP
10.0000 mg | Freq: Once | INTRAMUSCULAR | Status: AC
  Administered 2014-03-05: 10 mg

## 2014-03-05 NOTE — Progress Notes (Signed)
Subjective:     Patient ID: [name], male   DOB: 03-30-1966, 48 y.o.   MRN: [id number]  HPI patient states that my heel feels much better but the tendon on the outside has been bothering me   Review of Systems     Objective:   Physical Exam Neurovascular status intact with inflammation and pain around the peroneal insertion into the base of the fifth metatarsal left    Assessment:     Tendinitis left noted    Plan:     No treatment on heel currently and carefully injected the left peroneal insertion 3 mg Kenalog 5 mg Xylocaine and advised on ice therapy and reappoint if symptoms indicate

## 2014-03-10 ENCOUNTER — Telehealth: Payer: Self-pay | Admitting: Internal Medicine

## 2014-03-10 DIAGNOSIS — J4520 Mild intermittent asthma, uncomplicated: Secondary | ICD-10-CM

## 2014-03-10 DIAGNOSIS — J309 Allergic rhinitis, unspecified: Secondary | ICD-10-CM

## 2014-03-10 MED ORDER — FLUTICASONE-SALMETEROL 100-50 MCG/DOSE IN AEPB: 1.0000 | INHALATION_SPRAY | Freq: Two times a day (BID) | RESPIRATORY_TRACT | Status: DC

## 2014-03-10 MED ORDER — MONTELUKAST SODIUM 10 MG PO TABS: ORAL_TABLET | ORAL | Status: DC

## 2014-03-10 NOTE — Telephone Encounter (Signed)
LMOM that refills for Advair and Singulair were sent to pharmacy to last until ov on 05/08/14.

## 2014-05-08 ENCOUNTER — Ambulatory Visit (INDEPENDENT_AMBULATORY_CARE_PROVIDER_SITE_OTHER): Payer: 59 | Admitting: Internal Medicine

## 2014-05-08 ENCOUNTER — Encounter: Payer: Self-pay | Admitting: Internal Medicine

## 2014-05-08 VITALS — BP 126/72 | HR 49 | Ht 71.0 in | Wt 193.0 lb

## 2014-05-08 DIAGNOSIS — E785 Hyperlipidemia, unspecified: Secondary | ICD-10-CM

## 2014-05-08 DIAGNOSIS — Z23 Encounter for immunization: Secondary | ICD-10-CM

## 2014-05-08 DIAGNOSIS — J302 Other seasonal allergic rhinitis: Secondary | ICD-10-CM

## 2014-05-08 DIAGNOSIS — J3089 Other allergic rhinitis: Secondary | ICD-10-CM

## 2014-05-08 DIAGNOSIS — J4520 Mild intermittent asthma, uncomplicated: Secondary | ICD-10-CM

## 2014-05-08 DIAGNOSIS — J309 Allergic rhinitis, unspecified: Secondary | ICD-10-CM

## 2014-05-08 MED ORDER — FLUTICASONE-SALMETEROL 100-50 MCG/DOSE IN AEPB: 1.0000 | INHALATION_SPRAY | Freq: Two times a day (BID) | RESPIRATORY_TRACT | Status: DC

## 2014-05-08 MED ORDER — MONTELUKAST SODIUM 10 MG PO TABS: ORAL_TABLET | ORAL | Status: DC

## 2014-05-08 NOTE — Patient Instructions (Addendum)
Scripts sent refilling Advair and Singulair  Flu shot  Order- [facility] refer to establish primary care

## 2014-05-08 NOTE — Progress Notes (Signed)
08/19/11- 72 yoM never smoker followed for allergic rhinitis, hx allergic asthma, hx recurrent bronchopneumonia. LOV- doing well off of vaccine for the past year. No asthma. Had a cold a few weeks ago but shook it off. 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 FVC 5.69/109%, FEV1 3.38/81%, FEV1/FEC 0.59, FEF 25-75% 1.98/48%.  05/31/12- 10 yoM never smoker followed for allergic rhinitis, hx allergic asthma, hx recurrent bronchopneumonia. ACUTE VISIT: started yesterday-sore and tight chest, cough-productive-brown in color; denies any wheezing or SOB; fever of 49F this morning and was given tylenol and mucinex 2-3 days of chest rattle, green/brown sputum, temp 99. Trying otc's. Maxair inhaler helped today. Using Advair 100 once daily.  About to leave on a cruise.  05/08/14- 64 yoM never smoker followed for allergic rhinitis, hx allergic asthma, hx recurrent bronchopneumonia FOLLOWS FOR: Denies any wheezing, cough, congestion. Doing well overall.  He reports a very good year with no colds or respiratory exacerbations. We discussed medications and he asks for refill of Advair and Singulair. Flu shot.  ROS-see HPI Constitutional:   No-   weight loss, night sweats, fevers, chills, fatigue, lassitude. HEENT:   No-  headaches, difficulty swallowing, tooth/dental problems, sore throat,       No-  sneezing, itching, ear ache, no-nasal congestion, post nasal drip,  CV:  No-   chest pain, orthopnea, PND, swelling in lower extremities, anasarca, dizziness, palpitations Resp: No-   shortness of breath with exertion or at rest.              No- productive cough,  No non-productive cough,  No- coughing up of blood.              No- change in color of mucus.  No- wheezing.   Skin: No-   rash or lesions. GI:  No-   heartburn, indigestion, abdominal pain, nausea, vomiting GU: . MS:  No-   joint pain or swelling.  Neuro-     nothing unusual Psych:  No- change in mood or affect. No depression  or anxiety.  No memory loss.  OBJ- Physical Exam General- Alert, Oriented, Affect-appropriate, Distress- none acute Skin- rash-none, lesions- none, excoriation- none Lymphadenopathy- none Head- atraumatic            Eyes- Gross vision intact, PERRLA, conjunctivae and secretions clear            Ears- Hearing, canals-normal            Nose- +mucus bridging, no-Septal dev,  polyps, erosion, perforation             Throat- Mallampati II , mucosa clear , drainage- none, tonsils- atrophic Neck- flexible , trachea midline, no stridor , thyroid nl, carotid no bruit Chest - symmetrical excursion , unlabored           Heart/CV- RRR , no murmur , no gallop  , no rub, nl s1 s2                           - JVD- none , edema- none, stasis changes- none, varices- none           Lung- clear to P&A, wheeze- none, cough-none, dullness-none, rub- none           Chest wall-  Abd-  Br/ Gen/ Rectal- Not done, not indicated Extrem- cyanosis- none, clubbing, none, atrophy- none, strength- nl Neuro- grossly intact to observation

## 2014-05-11 NOTE — Assessment & Plan Note (Signed)
He has done extremely well over the past year with simple maintenance medication Plan-flu shot. He will follow with his primary physician and we will be happy to see him again as needed

## 2014-05-11 NOTE — Assessment & Plan Note (Signed)
Well-controlled without intervention needed now. We discussed seasonal antihistamine use

## 2014-06-21 ENCOUNTER — Other Ambulatory Visit: Payer: Self-pay | Admitting: Sports Medicine

## 2014-09-12 ENCOUNTER — Ambulatory Visit (INDEPENDENT_AMBULATORY_CARE_PROVIDER_SITE_OTHER): Payer: 59 | Admitting: Family Medicine

## 2014-09-12 ENCOUNTER — Encounter: Payer: Self-pay | Admitting: Family Medicine

## 2014-09-12 VITALS — BP 120/76 | Ht 71.0 in | Wt 195.0 lb

## 2014-09-12 DIAGNOSIS — M65851 Other synovitis and tenosynovitis, right thigh: Secondary | ICD-10-CM | POA: Diagnosis not present

## 2014-09-12 DIAGNOSIS — M76891 Other specified enthesopathies of right lower limb, excluding foot: Secondary | ICD-10-CM

## 2014-09-12 NOTE — Progress Notes (Signed)
Patient ID: [name], male   DOB: 10/02/1965, 49 y.o.   MRN: [id number]  [name] - 49 y.o. male MRN [id number]  Date of birth: 1965-12-08    SUBJECTIVE:     Right knee pain. Plays tennis fairly often and has had 2 or 3 really competitive matches in the last 3 weeks. Also had racquetball term. Has started having problems with his left knee feeling stiff and hot and swollen in the posterior portion. This last week he's really decreased both his tennis and racquetball and has not really had much improvement in his knee. He's been trying to stretch quite a bit. He's also been icing some. ROS:     No giving way, no popping of the right knee. He does feel like it's been swollen and yesterday it was warm but he does not recall seeing any redness of the knee. He's had no calf pain or tenderness. Systemically he's had no unusual weight change fever, sweats, chills, unusual bruising.  PERTINENT  PMH / PSH FH / / SH:  Past Medical, Surgical, Social, and Family History Reviewed & Updated in the EMR.  Pertinent findings include:  No prior history of knee injury or surgery. About a year or so ago he had some similar issues with his left knee which self resolved when he decreased his activities for about a 2 month period. History  plantar fasciitis which also has resolved. History of hyperlipidemia  OBJECTIVE: BP 120/76 mmHg  Ht 5\' 11"  (1.803 m)  Wt 195 lb (88.451 kg)  BMI 27.21 kg/m2  Physical Exam:  Vital signs are reviewed. GEN.: Well-developed male no acute distress KNEES: Symmetrical. Neither side reveals any effusion, erythema or warmth. The right knee has full range of motion in flexion extension. If he tries to squat and go into very full flexion he has some stiffness and pain that is noted particularly in the popliteal space. Negative Thessaly. He is ligamentously intact to varus and valgus stress, Lockman normal. Extremity: The calves bilaterally are soft without any tenderness to  palpation.  ULTRASOUND: Right knee. Patellar quadriceps tendon without defect. I see no appreciable fluid in the suprapatellar pouch. The lateral and medial menisci are seen well and there is no sign of any fluid. The menisci seem sharp and well-defined. Popliteal space reveals no Baker cyst. The attachment of the right semimembranosis and semitendinosus appears intact. There is some fluid noted surrounding the area of attachment of the medial hamstrings to the medial tibial condyle. There is no significant increase in Doppler activity noted on color flow Doppler here in the attachments look intact the entire hamstring is viewed and appears intact without any sign of edema or muscle defects. ASSESSMENT & PLAN:  See problem based charting & AVS for pt instructions.

## 2014-09-12 NOTE — Assessment & Plan Note (Signed)
It looks like he has some fluid around the attachment of his medial hamstring on the right. I suspect he's had a small strain here. Hamstring muscle itself looks totally intact. I think if he lays off stretching and intense activity for 7-10 days this will likely resolve. In reviewing his exercise routine he does a lot of stretching and high repetition low weight strengthening. I might recommend he increase his weight  very gradually on the hamstring curls he is doing still doing 20 or 30 reps at the very low weight he's using now, but then adding 2 more sets of slightly higher weights with the goal of 18 reps and then for the final set 12 reps. Think this will give him a little bit more strength at the tendon insertion area without tightening his whole hamstring. He plays a lot of very competitive tennis and racquetball and does a lot of lunging, starting stopping etc. which will place him stress on this muscle tendinous area. He also already has a body Helix sleeve which I recommended he wear for compression in the next few days during the day. I would also continue icing for the next few days. If he's not resolving, he'll return to clinic.

## 2014-10-17 ENCOUNTER — Other Ambulatory Visit: Payer: Self-pay | Admitting: Cardiology

## 2014-10-17 NOTE — Telephone Encounter (Signed)
Patient has not been seen in over two years, but is prn follow up. Ok to refill or defer to pcp? Please advise. Thanks, MI

## 2014-10-17 NOTE — Telephone Encounter (Signed)
Defer to PCP since follow up was prn

## 2014-10-27 ENCOUNTER — Ambulatory Visit: Payer: 59 | Admitting: Sports Medicine

## 2015-01-06 ENCOUNTER — Ambulatory Visit (INDEPENDENT_AMBULATORY_CARE_PROVIDER_SITE_OTHER): Payer: 59 | Admitting: Sports Medicine

## 2015-01-06 VITALS — BP 102/64 | Ht 71.0 in | Wt 190.0 lb

## 2015-01-06 DIAGNOSIS — M25561 Pain in right knee: Secondary | ICD-10-CM | POA: Diagnosis not present

## 2015-01-06 NOTE — Progress Notes (Signed)
    Subjective:  [name] is a 49 y.o. male who presents to the [facility] today with a chief complaint of right knee pain.   HPI:  Right Knee Pain.  Patient is competitive Air cabin crew. Reports that 4 months ago he hyperextended his knees and was seen at the [facility]. Had ultrasound performed then which showed irritation of his hamstring tendons, but otherwise no abnormalities were found.  Ten days ago the patient was playing racquetball when he hyperextended his knees again. He did not notice any popping sensations. He had a little bit of swelling and pain afterwards, however used his Helix compression wrap along with RICE therapy. States that his pain has now completely subsided, but still a little sore in the posterior aspect of his right knee. Pain does not radiate.  Objective:  Physical Exam: BP 102/64 mmHg  Ht 5\' 11"  (1.803 m)  Wt 190 lb (86.183 kg)  BMI 26.51 kg/m2  Gen: NAD, resting comfortably Lungs: NWOB Right Knee: No gross deformities or edema noted. No ecchymosis. Negative anterior and posterior draw tests. Stable to varus and valgus stress. FROM. Nontender along joint line. Tender along palpation of medial gastrocnemius head. No Baker cysts noted. Skin: warm, dry Neuro: Normal gait, moves all extremities Psych: Normal affect and thought content  Imaging: Bedside ultrasound: Significant for small right knee effusion and strain pattern in body of right gastrocnemius. No effusion noted in left knee. No Baker's cysts seen.   Assessment/Plan:   Right Knee Pain. Presence of right knee effusion seen on bedside ultrasound raises the possibility of an intra-articular process with referred pain to the posterior knee. Also may have strain of gastrocnemius muscle that is likely contributing. It is possible that he injured an intra-articular structure during his injury 4 months ago and re-aggravated it 10 days ago. Given his overall improvement in pain and other symptoms,  will treat conservatively at this point. Went over strengthening exercises for hip flexion, extension, and abduction. Can consider advanced imaging in the future if fails to improve or continues to have issues with knee pain.   Algis Greenhouse. Jerline Pain, [place] Resident PGY-2 01/06/2015 12:31 PM   Patient seen and evaluated with the above resident. I agree with the above plan of care. I do not appreciate any significant soft tissue injury in the posterior aspect of his knee on today's ultrasound but he does have a small knee effusion. This raises concern about an intra-articular process but given his overall improvement we will treat this conservatively with a home exercise program consisting of strengthening exercises. If symptoms persist or worsen we may need to consider merits of further diagnostic imaging both in the form of x-rays and potentially an MRI. Follow-up for ongoing or recalcitrant issues.

## 2015-01-06 NOTE — Addendum Note (Signed)
Addended by: Lilia Argue R on: 01/06/2015 03:00 PM   Modules accepted: Level of Service

## 2015-04-07 ENCOUNTER — Ambulatory Visit: Payer: 59 | Admitting: Family Medicine

## 2015-04-13 ENCOUNTER — Ambulatory Visit (INDEPENDENT_AMBULATORY_CARE_PROVIDER_SITE_OTHER): Payer: 59 | Admitting: Family Medicine

## 2015-04-13 ENCOUNTER — Encounter: Payer: Self-pay | Admitting: Family Medicine

## 2015-04-13 VITALS — BP 100/64 | Temp 98.6°F | Wt 182.0 lb

## 2015-04-13 DIAGNOSIS — J4520 Mild intermittent asthma, uncomplicated: Secondary | ICD-10-CM

## 2015-04-13 DIAGNOSIS — Z20828 Contact with and (suspected) exposure to other viral communicable diseases: Secondary | ICD-10-CM | POA: Diagnosis not present

## 2015-04-13 DIAGNOSIS — Z23 Encounter for immunization: Secondary | ICD-10-CM

## 2015-04-13 DIAGNOSIS — E785 Hyperlipidemia, unspecified: Secondary | ICD-10-CM | POA: Diagnosis not present

## 2015-04-13 DIAGNOSIS — L649 Androgenic alopecia, unspecified: Secondary | ICD-10-CM | POA: Insufficient documentation

## 2015-04-13 LAB — COMPREHENSIVE METABOLIC PANEL
ALT: 22 U/L (ref 0–53)
AST: 19 U/L (ref 0–37)
Albumin: 4.2 g/dL (ref 3.5–5.2)
Alkaline Phosphatase: 48 U/L (ref 39–117)
BUN: 24 mg/dL — AB (ref 6–23)
CHLORIDE: 104 meq/L (ref 96–112)
CO2: 31 mEq/L (ref 19–32)
CREATININE: 0.95 mg/dL (ref 0.40–1.50)
Calcium: 9.7 mg/dL (ref 8.4–10.5)
GFR: 89.55 mL/min (ref >60.00)
GLUCOSE: 86 mg/dL (ref 70–99)
POTASSIUM: 4.7 meq/L (ref 3.5–5.1)
SODIUM: 141 meq/L (ref 135–145)
Total Bilirubin: 1.2 mg/dL (ref 0.2–1.2)
Total Protein: 6.5 g/dL (ref 6.0–8.3)

## 2015-04-13 LAB — LIPID PANEL
Cholesterol: 117 mg/dL (ref 0–200)
HDL: 35.1 mg/dL — ABNORMAL LOW (ref >39.00)
LDL Cholesterol: 74 mg/dL (ref 0–99)
NONHDL: 82.3
TRIGLYCERIDES: 44 mg/dL (ref 0.0–149.0)
Total CHOL/HDL Ratio: 3
VLDL: 8.8 mg/dL (ref 0.0–40.0)

## 2015-04-13 MED ORDER — MONTELUKAST SODIUM 10 MG PO TABS: ORAL_TABLET | ORAL | Status: DC

## 2015-04-13 MED ORDER — ALBUTEROL SULFATE HFA 108 (90 BASE) MCG/ACT IN AERS: 2.0000 | INHALATION_SPRAY | Freq: Four times a day (QID) | RESPIRATORY_TRACT | Status: DC | PRN

## 2015-04-13 MED ORDER — FLUTICASONE-SALMETEROL 100-50 MCG/DOSE IN AEPB: 1.0000 | INHALATION_SPRAY | Freq: Two times a day (BID) | RESPIRATORY_TRACT | Status: DC

## 2015-04-13 MED ORDER — ATORVASTATIN CALCIUM 40 MG PO TABS: 40.0000 mg | ORAL_TABLET | Freq: Every day | ORAL | Status: DC

## 2015-04-13 NOTE — Assessment & Plan Note (Signed)
S: controlled with atorvastatin 40mg  Lab Results  Component Value Date   CHOL 117 04/13/2015   HDL 35.10* 04/13/2015   LDLCALC 74 04/13/2015   TRIG 44.0 04/13/2015   CHOLHDL 3 04/13/2015  A/P:Continue current meds, had considered reducing dose if LDL <60. Technically goal really only 100 but reasonable to leave at current LDL 74

## 2015-04-13 NOTE — Progress Notes (Addendum)
Garret Reddish, MD  Subjective:  [name] is a 49 y.o. year old very pleasant male patient who presents for/with See problem oriented charting ROS- No chest pain or shortness of breath. No headache or blurry vision.   Past Medical History-  Patient Active Problem List   Diagnosis Date Noted  . Hyperlipidemia 09/11/2012    Priority: Medium  . Bronchitis with asthma, acute 08/24/2007    Priority: Medium  . Asthma, mild intermittent, well-controlled 05/16/2007    Priority: Medium  . Hamstring tendinitis of right thigh 09/12/2014    Priority: Low  . Medial epicondylitis of right elbow 06/30/2011    Priority: Low  . Seasonal and perennial allergic rhinitis 05/16/2007    Priority: Low    Medications- reviewed and updated Current Outpatient Prescriptions  Medication Sig Dispense Refill  . atorvastatin (LIPITOR) 40 MG tablet Take 1 tablet (40 mg total) by mouth daily. 90 tablet 3  . FINASTERIDE PO Take 0.25 mg by mouth daily.    . Fluticasone-Salmeterol (ADVAIR) 100-50 MCG/DOSE AEPB Inhale 1 puff into the lungs 2 (two) times daily. 3 month supply please. Rinse mouth 90 each 3  . montelukast (SINGULAIR) 10 MG tablet TAKE 1 TABLET AT BEDTIME 90 tablet 3  . albuterol (PROVENTIL HFA;VENTOLIN HFA) 108 (90 BASE) MCG/ACT inhaler Inhale 2 puffs into the lungs every 6 (six) hours as needed for wheezing or shortness of breath (rescue inhaler). 1 Inhaler 0   No current facility-administered medications for this visit.    Objective: BP 100/64 mmHg  Temp(Src) 98.6 F (37 C)  Wt 182 lb (82.555 kg) Gen: NAD, resting comfortably CV: RRR no murmurs rubs or gallops Lungs: CTAB no crackles, wheeze, rhonchi Abdomen: soft/nontender/nondistended/normal bowel sounds. No rebound or guarding.  Ext: no edema Skin: warm, dry Neuro: grossly normal, moves all extremities  Assessment/Plan:  Asthma, mild intermittent, well-controlled S: controlled with advair 100-50 mcg 1 puff BID, singulair 10mg .  Uses rescue so sparingly last refill was maxair  A/P: refilled controllers but also provided albuterol rx for rescue   Hyperlipidemia S: controlled with atorvastatin 40mg  Lab Results  Component Value Date   CHOL 117 04/13/2015   HDL 35.10* 04/13/2015   LDLCALC 74 04/13/2015   TRIG 44.0 04/13/2015   CHOLHDL 3 04/13/2015  A/P:Continue current meds, had considered reducing dose if LDL <60. Technically goal really only 100 but reasonable to leave at current LDL 74   CPE 1 year  Orders Placed This Encounter  Procedures  . Flu Vaccine QUAD 36+ mos IM  . Lipid panel    [place]    Order Specific Question:  Has the patient fasted?    Answer:  No  . Comprehensive metabolic panel    [facility]    Order Specific Question:  Has the patient fasted?    Answer:  No  . HIV antibody    [facility]    Meds ordered this encounter  Medications  . montelukast (SINGULAIR) 10 MG tablet    Sig: TAKE 1 TABLET AT BEDTIME    Dispense:  90 tablet    Refill:  3  . Fluticasone-Salmeterol (ADVAIR) 100-50 MCG/DOSE AEPB    Sig: Inhale 1 puff into the lungs 2 (two) times daily. 3 month supply please. Rinse mouth    Dispense:  90 each    Refill:  3  . albuterol (PROVENTIL HFA;VENTOLIN HFA) 108 (90 BASE) MCG/ACT inhaler    Sig: Inhale 2 puffs into the lungs every 6 (six) hours as needed for wheezing  or shortness of breath (rescue inhaler).    Dispense:  1 Inhaler    Refill:  0  . atorvastatin (LIPITOR) 40 MG tablet    Sig: Take 1 tablet (40 mg total) by mouth daily.    Dispense:  90 tablet    Refill:  3   >50% of 25 minute office visit was spent on counseling (we discussed plans for prostate cancer screening next year- patient will consider options, asthma maintenance, need for HIV testing especially with prior OR time) and coordination of care  Received flu shot today- Clyde Lundborg to enter Immunization History  Administered Date(s) Administered  . Influenza Split 03/07/2011, 05/17/2012  . Influenza  Whole 02/22/2008, 03/03/2009  . Influenza,inj,Quad PF,36+ Mos 03/25/2013, 05/08/2014, 04/13/2015  . Tdap 03/25/2013

## 2015-04-13 NOTE — Patient Instructions (Addendum)
Always great to meet another [place]!   Labs before you leave today. Call with results. If LDL less than 60, let's trial 20mg  lipitor and repeat lipids in 1 year  Refilled asthma meds and sent in new rescue inhaler.   See you in a year unless you need [place] sooner

## 2015-04-13 NOTE — Assessment & Plan Note (Signed)
S: controlled with advair 100-50 mcg 1 puff BID, singulair 10mg . Uses rescue so sparingly last refill was maxair  A/P: refilled controllers but also provided albuterol rx for rescue

## 2015-04-14 LAB — HIV ANTIBODY (ROUTINE TESTING W REFLEX): HIV: NONREACTIVE

## 2015-10-09 LAB — LIPID PANEL
CHOLESTEROL: 100 mg/dL (ref 0–200)
HDL: 40 mg/dL (ref 35–70)
LDL Cholesterol: 50 mg/dL
TRIGLYCERIDES: 48 mg/dL (ref 40–160)

## 2015-10-09 LAB — BASIC METABOLIC PANEL: GLUCOSE: 87 mg/dL

## 2015-11-22 ENCOUNTER — Other Ambulatory Visit: Payer: Self-pay | Admitting: Cardiology

## 2015-11-26 ENCOUNTER — Other Ambulatory Visit: Payer: Self-pay | Admitting: *Deleted

## 2015-11-26 ENCOUNTER — Other Ambulatory Visit: Payer: Self-pay | Admitting: Cardiology

## 2015-11-26 MED ORDER — ATORVASTATIN CALCIUM 40 MG PO TABS
40.0000 mg | ORAL_TABLET | Freq: Every day | ORAL | Status: DC
Start: 2015-11-26 — End: 2017-04-17

## 2016-04-06 ENCOUNTER — Other Ambulatory Visit (INDEPENDENT_AMBULATORY_CARE_PROVIDER_SITE_OTHER): Payer: 59

## 2016-04-06 DIAGNOSIS — Z0000 Encounter for general adult medical examination without abnormal findings: Secondary | ICD-10-CM

## 2016-04-06 LAB — BASIC METABOLIC PANEL
BUN: 26 mg/dL — ABNORMAL HIGH (ref 6–23)
CHLORIDE: 103 meq/L (ref 96–112)
CO2: 31 mEq/L (ref 19–32)
CREATININE: 1 mg/dL (ref 0.40–1.50)
Calcium: 9.5 mg/dL (ref 8.4–10.5)
GFR: 84.06 mL/min (ref >60.00)
Glucose, Bld: 90 mg/dL (ref 70–99)
POTASSIUM: 4.1 meq/L (ref 3.5–5.1)
Sodium: 139 mEq/L (ref 135–145)

## 2016-04-06 LAB — HEPATIC FUNCTION PANEL
ALBUMIN: 4.4 g/dL (ref 3.5–5.2)
ALK PHOS: 57 U/L (ref 39–117)
ALT: 27 U/L (ref 0–53)
AST: 22 U/L (ref 0–37)
BILIRUBIN TOTAL: 1.4 mg/dL — AB (ref 0.2–1.2)
Bilirubin, Direct: 0.3 mg/dL (ref 0.0–0.3)
Total Protein: 6.9 g/dL (ref 6.0–8.3)

## 2016-04-06 LAB — POC URINALSYSI DIPSTICK (AUTOMATED)
Bilirubin, UA: NEGATIVE
Blood, UA: NEGATIVE
GLUCOSE UA: NEGATIVE
Ketones, UA: NEGATIVE
Leukocytes, UA: NEGATIVE
NITRITE UA: NEGATIVE
Protein, UA: NEGATIVE
Spec Grav, UA: 1.015
UROBILINOGEN UA: 0.2
pH, UA: 6.5

## 2016-04-06 LAB — CBC WITH DIFFERENTIAL/PLATELET
BASOS ABS: 0 10*3/uL (ref 0.0–0.1)
Basophils Relative: 0.6 % (ref 0.0–3.0)
Eosinophils Absolute: 0.5 10*3/uL (ref 0.0–0.7)
Eosinophils Relative: 6.7 % — ABNORMAL HIGH (ref 0.0–5.0)
HCT: 48.6 % (ref 39.0–52.0)
HEMOGLOBIN: 16.8 g/dL (ref 13.0–17.0)
LYMPHS ABS: 2.3 10*3/uL (ref 0.7–4.0)
Lymphocytes Relative: 31.8 % (ref 12.0–46.0)
MCHC: 34.6 g/dL (ref 30.0–36.0)
MCV: 91.1 fl (ref 78.0–100.0)
MONOS PCT: 8.5 % (ref 3.0–12.0)
Monocytes Absolute: 0.6 10*3/uL (ref 0.1–1.0)
NEUTROS PCT: 52.4 % (ref 43.0–77.0)
Neutro Abs: 3.9 10*3/uL (ref 1.4–7.7)
Platelets: 164 10*3/uL (ref 150.0–400.0)
RBC: 5.34 Mil/uL (ref 4.22–5.81)
RDW: 13.1 % (ref 11.5–15.5)
WBC: 7.4 10*3/uL (ref 4.0–10.5)

## 2016-04-06 LAB — TSH: TSH: 2.03 u[IU]/mL (ref 0.35–4.50)

## 2016-04-06 LAB — PSA: PSA: 0.34 ng/mL (ref 0.10–4.00)

## 2016-04-14 ENCOUNTER — Other Ambulatory Visit: Payer: Self-pay

## 2016-04-14 ENCOUNTER — Ambulatory Visit (INDEPENDENT_AMBULATORY_CARE_PROVIDER_SITE_OTHER): Payer: 59 | Admitting: Family Medicine

## 2016-04-14 ENCOUNTER — Encounter: Payer: Self-pay | Admitting: Family Medicine

## 2016-04-14 VITALS — BP 104/62 | HR 68 | Temp 97.8°F | Ht 71.5 in | Wt 184.2 lb

## 2016-04-14 DIAGNOSIS — J4520 Mild intermittent asthma, uncomplicated: Secondary | ICD-10-CM

## 2016-04-14 DIAGNOSIS — E785 Hyperlipidemia, unspecified: Secondary | ICD-10-CM | POA: Diagnosis not present

## 2016-04-14 DIAGNOSIS — Z1211 Encounter for screening for malignant neoplasm of colon: Secondary | ICD-10-CM | POA: Diagnosis not present

## 2016-04-14 DIAGNOSIS — Z0000 Encounter for general adult medical examination without abnormal findings: Secondary | ICD-10-CM

## 2016-04-14 MED ORDER — MONTELUKAST SODIUM 10 MG PO TABS: ORAL_TABLET | ORAL | 3 refills | Status: DC

## 2016-04-14 MED ORDER — ALBUTEROL SULFATE HFA 108 (90 BASE) MCG/ACT IN AERS: 2.0000 | INHALATION_SPRAY | Freq: Four times a day (QID) | RESPIRATORY_TRACT | 0 refills | Status: DC | PRN

## 2016-04-14 MED ORDER — FINASTERIDE 5 MG PO TABS: ORAL_TABLET | ORAL | 3 refills | Status: DC

## 2016-04-14 MED ORDER — FLUTICASONE-SALMETEROL 100-50 MCG/DOSE IN AEPB: 1.0000 | INHALATION_SPRAY | Freq: Two times a day (BID) | RESPIRATORY_TRACT | 3 refills | Status: DC

## 2016-04-14 NOTE — Progress Notes (Signed)
Phone: [phone]  Subjective:  Patient presents today for their annual physical. Chief complaint-noted.   See problem oriented charting- ROS- full  review of systems was completed and negative except for: occasional SOb relieved by albuterol  The following were reviewed and entered/updated in epic: Past Medical History:  Diagnosis Date  . Anxiety   . Asthma   . Bronchopneumonia    Recurrent  . Hyperlipidemia    Patient Active Problem List   Diagnosis Date Noted  . Hyperlipidemia 09/11/2012    Priority: Medium  . Bronchitis with asthma, acute 08/24/2007    Priority: Medium  . Asthma, mild intermittent, well-controlled 05/16/2007    Priority: Medium  . 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 Hamstring tendinitis of right thigh 09/12/2014    Priority: Low  . Medial epicondylitis of right elbow 06/30/2011    Priority: Low  . Seasonal and perennial allergic rhinitis 05/16/2007    Priority: Low   Past Surgical History:  Procedure Laterality Date  . torn labrum right shoulder  2008  . VASECTOMY  2007    Family History  Problem Relation Age of Onset  . Allergic rhinitis Daughter   . Hyperlipidemia Mother   . Hyperlipidemia Father     and sister  . Heart disease Maternal Grandmother   . Heart disease Maternal Grandfather     Medications- reviewed and updated Current Outpatient Prescriptions  Medication Sig Dispense Refill  . albuterol (PROVENTIL HFA;VENTOLIN HFA) 108 (90 Base) MCG/ACT inhaler Inhale 2 puffs into the lungs every 6 (six) hours as needed for wheezing or shortness of breath (rescue inhaler). 1 Inhaler 0  . aspirin EC 81 MG tablet Take 81 mg by mouth daily.    Marland Kitchen atorvastatin (LIPITOR) 40 MG tablet Take 1 tablet (40 mg total) by mouth daily. 90 tablet 3  . finasteride (PROSCAR) 5 MG tablet Take 1/4 of a pill daily 25 tablet 3  . Fluticasone-Salmeterol (ADVAIR) 100-50 MCG/DOSE AEPB Inhale 1 puff into the lungs 2 (two) times daily. 3 month  supply please. Rinse mouth 90 each 3  . montelukast (SINGULAIR) 10 MG tablet TAKE 1 TABLET AT BEDTIME 90 tablet 3   No current facility-administered medications for this visit.     Allergies-reviewed and updated Allergies  Allergen Reactions  . Minocycline Rash    Erythema Multiforme    Social History   Social History  . Marital status: Married    Spouse name: N/A  . Number of children: N/A  . Years of education: N/A   Occupational History  . 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 Smoking status: Never Smoker  . Smokeless tobacco: Never Used  . Alcohol use 0.6 oz/week    1 Standard drinks or equivalent per week  . Drug use: No  . Sexual activity: Not Asked   Other Topics Concern  . None   Social History Narrative   Married. 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 Worked in [place] for 7 years before that. [employer] for 5 years.    Father is retired Development worker, international aid is physician (endocrinologist)      Hobbies: tennis and raquetball, time with kids and family, church- Probation officer          Objective: BP 104/62 (BP Location: Left Arm, Patient Position: Sitting, Cuff Size: Large)   Pulse 68  Temp 97.8 F (36.6 C) (Oral)   Ht 5' 11.5" (1.816 m)   Wt 184 lb 3.2 oz (83.6 kg)   SpO2 98%   BMI 25.33 kg/m  Gen: NAD, resting comfortably HEENT: Mucous membranes are moist. Oropharynx normal Neck: no thyromegaly CV: RRR no murmurs rubs or gallops Lungs: CTAB no crackles, wheeze, rhonchi Abdomen: soft/nontender/nondistended/normal bowel sounds. No rebound or guarding.  Ext: no edema Skin: warm, dry Neuro: grossly normal, moves all extremities, PERRLA Rectal: normal tone, normal sized prostate, no masses or tenderness   Assessment/Plan:  50 y.o. male presenting for annual physical.  Health Maintenance counseling: 1. Anticipatory  guidance: Patient counseled regarding regular dental exams, eye exams- using readers but has not seen eye doctor, wearing seatbelts.  2. Risk factor reduction:  Advised patient of need for regular exercise and diet rich and fruits and vegetables to reduce risk of heart attack and stroke. Playing tennis 1/2 year and raquetball 1/2 year- 4x a year 3. Immunizations/screenings/ancillary studies Immunization History  Administered Date(s) Administered  . Influenza Split 03/07/2011, 05/17/2012  . Influenza Whole 02/22/2008, 03/03/2009  . Influenza,inj,Quad PF,36+ Mos 03/25/2013, 05/08/2014, 04/13/2015  . Influenza-Unspecified 04/07/2016  . Tdap 03/25/2013   4. Prostate cancer screening-  low risk based off rectal exam and PSA trend, keep in mind on finasteride but missed 3 months Lab Results  Component Value Date   PSA 0.34 04/06/2016   5. Colon cancer screening - refer today 6. Skin cancer screening- waist up exam- low risk.   Status of chronic or acute concerns   Asthma- controlled on advair BID and singulair 10mg  (also helps his allergies). Albuterol prn: 2-3x  In a year. Saw Dr. Annamaria Boots in the past. Doing very well- refille dmeds  Keep Eye on bilirubin- slightly high today. Advised check at least yearly  Male pattern baldness- finasteride 1/4 pill 5mg  daily. Missed for last 3 months- no significant loss- discussed risks of potential increase in high grade prostate cancers on this  Hyperlipidemia S: well controlled on atorvastatin 40mg . No myalgias. Follows with Dr. Aundra Dubin (plays tennis with him)  A/P: brings #s from work with LDL at 71, total at 100, HDL at 40. Excellent control and with history of high calcium score- discussed continuing regimen. Also on aspirin 81mg   Orders Placed This Encounter  Procedures  . Ambulatory referral to Gastroenterology    Referral Priority:   Routine    Referral Type:   Consultation    Referral Reason:   Specialty Services Required    Number of Visits  Requested:   1   Meds ordered this encounter  Medications  . finasteride (PROSCAR) 5 MG tablet    Sig: Take 1/4 of a pill daily    Dispense:  25 tablet    Refill:  3  . albuterol (PROVENTIL HFA;VENTOLIN HFA) 108 (90 Base) MCG/ACT inhaler    Sig: Inhale 2 puffs into the lungs every 6 (six) hours as needed for wheezing or shortness of breath (rescue inhaler).    Dispense:  1 Inhaler    Refill:  0  . aspirin EC 81 MG tablet    Sig: Take 81 mg by mouth daily.   Return precautions advised.  Garret Reddish, MD

## 2016-04-14 NOTE — Assessment & Plan Note (Signed)
S: well controlled on atorvastatin 40mg . No myalgias. Follows with Dr. Aundra Dubin (plays tennis with him)  A/P: brings #s from work with LDL at 110, total at 100, HDL at 40. Excellent control and with history of high calcium score- discussed continuing regimen. Also on aspirin 81mg 

## 2016-04-14 NOTE — Progress Notes (Signed)
Pre visit review using our clinic review tool, if applicable. No additional management support is needed unless otherwise documented below in the visit note. 

## 2016-04-14 NOTE — Patient Instructions (Addendum)
We will call you within a week about your referral to GI. If you do not hear within 2 weeks, give [place] a call.   You are doing phenomenally well. I certainly wish more of my patients were in as great of shape as you- keep up good work.   Continue atorvastatin 40mg  with ideal control of lipids  Please consider signing up for mychart

## 2016-06-16 ENCOUNTER — Encounter: Payer: Self-pay | Admitting: Family Medicine

## 2016-09-14 ENCOUNTER — Telehealth: Payer: Self-pay | Admitting: Family Medicine

## 2016-09-14 NOTE — Telephone Encounter (Signed)
Pt can not afford advair. Pt would like a new rx fluticasone propionate/salmeterol dpi 55/14 mcg .[facility] for 6 month supply

## 2016-09-15 MED ORDER — FLUTICASONE-SALMETEROL 55-14 MCG/ACT IN AEPB: 2.0000 | INHALATION_SPRAY | Freq: Two times a day (BID) | RESPIRATORY_TRACT | 3 refills | Status: DC

## 2016-09-15 NOTE — Telephone Encounter (Signed)
Called and left a detailed voicemail informing patient that it had been sent in. I also provided a call back number

## 2016-09-15 NOTE — Telephone Encounter (Signed)
I sent this in- please inform patient.  

## 2016-11-07 ENCOUNTER — Other Ambulatory Visit: Payer: Self-pay | Admitting: Cardiology

## 2017-04-14 ENCOUNTER — Other Ambulatory Visit: Payer: Self-pay | Admitting: Family Medicine

## 2017-04-17 ENCOUNTER — Encounter: Payer: Self-pay | Admitting: Family Medicine

## 2017-04-17 ENCOUNTER — Encounter: Payer: Self-pay | Admitting: Internal Medicine

## 2017-04-17 ENCOUNTER — Ambulatory Visit (INDEPENDENT_AMBULATORY_CARE_PROVIDER_SITE_OTHER): Payer: 59 | Admitting: Family Medicine

## 2017-04-17 ENCOUNTER — Other Ambulatory Visit: Payer: Self-pay

## 2017-04-17 VITALS — BP 90/70 | HR 48 | Temp 97.6°F | Ht 71.5 in | Wt 183.4 lb

## 2017-04-17 DIAGNOSIS — J4520 Mild intermittent asthma, uncomplicated: Secondary | ICD-10-CM

## 2017-04-17 DIAGNOSIS — Z0000 Encounter for general adult medical examination without abnormal findings: Secondary | ICD-10-CM

## 2017-04-17 DIAGNOSIS — Z1211 Encounter for screening for malignant neoplasm of colon: Secondary | ICD-10-CM

## 2017-04-17 DIAGNOSIS — Z125 Encounter for screening for malignant neoplasm of prostate: Secondary | ICD-10-CM

## 2017-04-17 DIAGNOSIS — E785 Hyperlipidemia, unspecified: Secondary | ICD-10-CM | POA: Diagnosis not present

## 2017-04-17 LAB — COMPREHENSIVE METABOLIC PANEL
ALBUMIN: 4.5 g/dL (ref 3.5–5.2)
ALK PHOS: 66 U/L (ref 39–117)
ALT: 24 U/L (ref 0–53)
AST: 20 U/L (ref 0–37)
BUN: 18 mg/dL (ref 6–23)
CALCIUM: 9.9 mg/dL (ref 8.4–10.5)
CHLORIDE: 101 meq/L (ref 96–112)
CO2: 30 mEq/L (ref 19–32)
Creatinine, Ser: 0.98 mg/dL (ref 0.40–1.50)
GFR: 85.69 mL/min (ref >60.00)
Glucose, Bld: 83 mg/dL (ref 70–99)
POTASSIUM: 4.2 meq/L (ref 3.5–5.1)
SODIUM: 140 meq/L (ref 135–145)
TOTAL PROTEIN: 7.3 g/dL (ref 6.0–8.3)
Total Bilirubin: 1.7 mg/dL — ABNORMAL HIGH (ref 0.2–1.2)

## 2017-04-17 LAB — CBC
HEMATOCRIT: 51.9 % (ref 39.0–52.0)
HEMOGLOBIN: 17.8 g/dL — AB (ref 13.0–17.0)
MCHC: 34.3 g/dL (ref 30.0–36.0)
MCV: 93.5 fl (ref 78.0–100.0)
PLATELETS: 162 10*3/uL (ref 150.0–400.0)
RBC: 5.55 Mil/uL (ref 4.22–5.81)
RDW: 12.8 % (ref 11.5–15.5)
WBC: 7.5 10*3/uL (ref 4.0–10.5)

## 2017-04-17 LAB — PSA: PSA: 0.19 ng/mL (ref 0.10–4.00)

## 2017-04-17 MED ORDER — ALBUTEROL SULFATE HFA 108 (90 BASE) MCG/ACT IN AERS: 2.0000 | INHALATION_SPRAY | Freq: Four times a day (QID) | RESPIRATORY_TRACT | 3 refills | Status: DC | PRN

## 2017-04-17 MED ORDER — FINASTERIDE 5 MG PO TABS: ORAL_TABLET | ORAL | 3 refills | Status: DC

## 2017-04-17 MED ORDER — MONTELUKAST SODIUM 10 MG PO TABS: ORAL_TABLET | ORAL | 3 refills | Status: DC

## 2017-04-17 MED ORDER — ATORVASTATIN CALCIUM 40 MG PO TABS
40.0000 mg | ORAL_TABLET | Freq: Every day | ORAL | 3 refills | Status: DC
Start: 2017-04-17 — End: 2018-04-17

## 2017-04-17 MED ORDER — FLUTICASONE-SALMETEROL 55-14 MCG/ACT IN AEPB: 2.0000 | INHALATION_SPRAY | Freq: Two times a day (BID) | RESPIRATORY_TRACT | 3 refills | Status: DC

## 2017-04-17 NOTE — Patient Instructions (Addendum)
Thanks for getting your flu shot  If not doing better by next Monday or worsen before then let us know and I can call in prednisone. If more sinus pressure and sinus discharge would go with antibiotic like augmentin  We will call you within a week or two about your referral to GI. If you do not hear within 3 weeks, give [place] a call.   2 other questions- dentist. Eye doctor?  Send [place] those #s for cholesterol if you dont mind

## 2017-04-17 NOTE — Assessment & Plan Note (Signed)
HLD-controlled on atorvastatin 40mg . 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 Sees Dr. Aundra Dubin for this at times- plays tennis with him. Usually gets checked at work - had in January- will drop this off. On aspirin 81mg .   Wants to be aggressive with prior ct cardiac scoring elevated Lab Results  Component Value Date   CHOL 100 10/09/2015   HDL 40 10/09/2015   LDLCALC 50 10/09/2015   TRIG 48 10/09/2015   CHOLHDL 3 04/13/2015

## 2017-04-17 NOTE — Progress Notes (Signed)
Phone: [phone]  Subjective:  Patient presents today for their annual physical. Chief complaint-noted.   See problem oriented charting- ROS- full  review of systems was completed and negative except for: congestion, sore throat, voice change, cough, mil d.sob  The following were reviewed and entered/updated in epic: Past Medical History:  Diagnosis Date  . Anxiety   . Asthma   . Bronchopneumonia    Recurrent  . Hyperlipidemia    Patient Active Problem List   Diagnosis Date Noted  . Hyperlipidemia 09/11/2012    Priority: Medium  . Bronchitis with asthma, acute 08/24/2007    Priority: Medium  . Asthma, mild intermittent, well-controlled 05/16/2007    Priority: Medium  . 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 Hamstring tendinitis of right thigh 09/12/2014    Priority: Low  . Medial epicondylitis of right elbow 06/30/2011    Priority: Low  . Seasonal and perennial allergic rhinitis 05/16/2007    Priority: Low   Past Surgical History:  Procedure Laterality Date  . torn labrum right shoulder  2008  . VASECTOMY  2007    Family History  Problem Relation Age of Onset  . Healthy Mother   . Hyperlipidemia Father   . Hyperlipidemia Sister   . Healthy Brother   . Allergic rhinitis Daughter   . Heart disease Maternal Grandmother   . Heart disease Maternal Grandfather     Medications- reviewed and updated Current Outpatient Medications  Medication Sig Dispense Refill  . albuterol (PROVENTIL HFA;VENTOLIN HFA) 108 (90 Base) MCG/ACT inhaler Inhale 2 puffs into the lungs every 6 (six) hours as needed for wheezing or shortness of breath (rescue inhaler). 1 Inhaler 0  . aspirin EC 81 MG tablet Take 81 mg by mouth daily.    Marland Kitchen atorvastatin (LIPITOR) 40 MG tablet Take 1 tablet (40 mg total) by mouth daily. 90 tablet 3  . finasteride (PROSCAR) 5 MG tablet TAKE 1/4 TABLET BY MOUTH DAILY 25 tablet 2  . Fluticasone-Salmeterol 55-14 MCG/ACT AEPB Inhale 2 puffs into  the lungs 2 (two) times daily. 3 each 3  . montelukast (SINGULAIR) 10 MG tablet TAKE 1 TABLET AT BEDTIME 90 tablet 3   No current facility-administered medications for this visit.     Allergies-reviewed and updated Allergies  Allergen Reactions  . Minocycline Rash    Erythema Multiforme    Social History   Socioeconomic History  . Marital status: Married    Spouse name: None  . Number of children: None  . Years of education: None  . Highest education level: None  Social Needs  . Financial resource strain: None  . Food insecurity - worry: None  . Food insecurity - inability: None  . Transportation needs - medical: None  . Transportation needs - non-medical: None  Occupational History  . 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 Smoking status: Never Smoker  . Smokeless tobacco: Never Used  Substance and Sexual Activity  . Alcohol use: Yes    Alcohol/week: 0.6 oz    Types: 1 Standard drinks or equivalent per week  . Drug use: No  . Sexual activity: None  Other Topics Concern  . None  Social History Narrative   Married. 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 Worked in [place] for 7 years before that. [employer] for 5 years.    Father is retired Network engineer  Brother is physician (endocrinologist)      Hobbies: tennis and raquetball, time with kids and family, church- Probation officer       Objective: BP 90/70 (BP Location: Left Arm, Patient Position: Sitting, Cuff Size: Large)   Pulse (!) 48   Temp 97.6 F (36.4 C) (Oral)   Ht 5' 11.5" (1.816 m)   Wt 183 lb 6.4 oz (83.2 kg)   SpO2 96%   BMI 25.22 kg/m  Gen: NAD, resting comfortably HEENT: Mucous membranes are moist. Oropharynx normal Neck: no thyromegaly CV: bradycardic but regular no murmurs rubs or gallops. HR low 50s on my exam Lungs: CTAB no crackles, wheeze, rhonchi Abdomen:  soft/nontender/nondistended/normal bowel sounds. No rebound or guarding.  Ext: no edema Skin: warm, dry Neuro: grossly normal, moves all extremities, PERRLA Rectal: normal tone, normal sized prostate, no masses or tenderness  Assessment/Plan:  51 y.o. male presenting for annual physical.  Health Maintenance counseling: 1. Anticipatory guidance: Patient counseled regarding regular dental exams - q9 months or so, eye exams - no regular visits, wearing seatbelts.  2. Risk factor reduction:  Advised patient of need for regular exercise and diet rich and fruits and vegetables to reduce risk of heart attack and stroke. Exercise- last year patient playing tennis1/2 the year and raquetball 1/2 the year- 4x a week - currently, keeping this up plus walking 4-5x a week. Diet- reasonable. Weight reasonable in 180s Wt Readings from Last 3 Encounters:  04/17/17 183 lb 6.4 oz (83.2 kg)  04/14/16 184 lb 3.2 oz (83.6 kg)  04/13/15 182 lb (82.6 kg)  3. Immunizations/screenings/ancillary studies- flu shot a few weeks ago Immunization History  Administered Date(s) Administered  . Influenza Split 03/07/2011, 05/17/2012  . Influenza Whole 02/22/2008, 03/03/2009  . Influenza,inj,Quad PF,6+ Mos 03/25/2013, 05/08/2014, 04/13/2015  . Influenza-Unspecified 04/07/2016, 03/31/2017  . Tdap 03/25/2013  4. Prostate cancer screening-  update PSA trend. Is on finasteride for hair loss prevention Lab Results  Component Value Date   PSA 0.34 04/06/2016   5. Colon cancer screening - referred for colonoscopy last year but did not return GI calls. Referred again today 6. Skin cancer screening- advised regular sunscreen use. Denies worrisome, changing, or new skin lesions. We did a waist up exam today but he has no regular dermatologist - discussed would be reasonable to see one, holding off for now  Status of chronic or acute concerns   Finasteride for male pattern baldness. 1/4 pill of 5mg  daily.   Asthma, mild  intermittent, well-controlled Dr. Annamaria Boots in past. Remains on fluticasone- salmeterol BID and singular 10mg  (also for allergies). Uses albuterol 2-3x a year outside of illnesses- used 2 days ago. Currently in the middle of a cold- taking xicam spray- going to change to capsules. Some green discharge. No sinus pressure. Not wheezing heavily, still has good energy, no chest tightness. 4 days into illness.   Hyperlipidemia HLD-controlled on atorvastatin 40mg . 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 Sees Dr. Aundra Dubin for this at times- plays tennis with him. Usually gets checked at work - had in January- will drop this off. On aspirin 81mg .   Wants to be aggressive with prior ct cardiac scoring elevated Lab Results  Component Value Date   CHOL 100 10/09/2015   HDL 40 10/09/2015   LDLCALC 50 10/09/2015   TRIG 48 10/09/2015   CHOLHDL 3 04/13/2015    1 year CPE  Orders Placed This Encounter  Procedures  . PSA  . CBC  [place]  . Comprehensive metabolic panel    [place]  . Ambulatory referral to Gastroenterology    Referral Priority:   Routine    Referral Type:   Consultation    Referral Reason:   Specialty Services Required    Number of Visits Requested:   1   Return precautions advised.  Garret Reddish, MD

## 2017-04-17 NOTE — Assessment & Plan Note (Signed)
Dr. Annamaria Boots in past. Remains on fluticasone- salmeterol BID and singular 10mg  (also for allergies). Uses albuterol 2-3x a year outside of illnesses- used 2 days ago. Currently in the middle of a cold- taking xicam spray- going to change to capsules. Some green discharge. No sinus pressure. Not wheezing heavily, still has good energy, no chest tightness. 4 days into illness.

## 2017-04-18 ENCOUNTER — Other Ambulatory Visit: Payer: Self-pay

## 2017-04-18 DIAGNOSIS — D751 Secondary polycythemia: Secondary | ICD-10-CM

## 2017-04-18 DIAGNOSIS — R17 Unspecified jaundice: Secondary | ICD-10-CM

## 2017-04-18 MED ORDER — FLUTICASONE-SALMETEROL 55-14 MCG/ACT IN AEPB: 1.0000 | INHALATION_SPRAY | Freq: Two times a day (BID) | RESPIRATORY_TRACT | 3 refills | Status: DC

## 2017-04-18 NOTE — Addendum Note (Signed)
Addended by: Elmer Bales on: 04/18/2017 10:17 AM   Modules accepted: Orders

## 2017-04-19 MED ORDER — PREDNISONE 20 MG PO TABS: ORAL_TABLET | ORAL | 0 refills | Status: DC

## 2017-04-25 ENCOUNTER — Other Ambulatory Visit: Payer: Self-pay | Admitting: Family Medicine

## 2017-04-25 DIAGNOSIS — D751 Secondary polycythemia: Secondary | ICD-10-CM

## 2017-04-25 DIAGNOSIS — R17 Unspecified jaundice: Secondary | ICD-10-CM

## 2017-05-02 ENCOUNTER — Ambulatory Visit (AMBULATORY_SURGERY_CENTER): Payer: Self-pay

## 2017-05-02 ENCOUNTER — Other Ambulatory Visit: Payer: Self-pay

## 2017-05-02 VITALS — Ht 71.0 in | Wt 187.6 lb

## 2017-05-02 DIAGNOSIS — Z1211 Encounter for screening for malignant neoplasm of colon: Secondary | ICD-10-CM

## 2017-05-02 MED ORDER — NA SULFATE-K SULFATE-MG SULF 17.5-3.13-1.6 GM/177ML PO SOLN: 1.0000 | Freq: Once | ORAL | 0 refills | Status: AC

## 2017-05-02 NOTE — Progress Notes (Signed)
Denies allergies to eggs or soy products. Denies complication of anesthesia or sedation. Denies use of weight loss medication. Denies use of O2.   Emmi instructions declined.  

## 2017-05-11 ENCOUNTER — Encounter: Payer: Self-pay | Admitting: Internal Medicine

## 2017-05-17 ENCOUNTER — Encounter: Payer: 59 | Admitting: Internal Medicine

## 2017-05-19 ENCOUNTER — Other Ambulatory Visit (INDEPENDENT_AMBULATORY_CARE_PROVIDER_SITE_OTHER): Payer: 59

## 2017-05-19 DIAGNOSIS — D751 Secondary polycythemia: Secondary | ICD-10-CM | POA: Diagnosis not present

## 2017-05-19 DIAGNOSIS — R17 Unspecified jaundice: Secondary | ICD-10-CM

## 2017-05-19 LAB — HEPATIC FUNCTION PANEL
ALT: 24 U/L (ref 0–53)
AST: 20 U/L (ref 0–37)
Albumin: 4.2 g/dL (ref 3.5–5.2)
Alkaline Phosphatase: 65 U/L (ref 39–117)
BILIRUBIN TOTAL: 1.3 mg/dL — AB (ref 0.2–1.2)
Bilirubin, Direct: 0.3 mg/dL (ref 0.0–0.3)
TOTAL PROTEIN: 7.1 g/dL (ref 6.0–8.3)

## 2017-05-19 LAB — CBC WITH DIFFERENTIAL/PLATELET
BASOS ABS: 0 10*3/uL (ref 0.0–0.1)
Basophils Relative: 0.6 % (ref 0.0–3.0)
Eosinophils Absolute: 0.1 10*3/uL (ref 0.0–0.7)
Eosinophils Relative: 2.1 % (ref 0.0–5.0)
HCT: 50.1 % (ref 39.0–52.0)
Hemoglobin: 16.8 g/dL (ref 13.0–17.0)
LYMPHS ABS: 2.2 10*3/uL (ref 0.7–4.0)
Lymphocytes Relative: 34.5 % (ref 12.0–46.0)
MCHC: 33.6 g/dL (ref 30.0–36.0)
MCV: 93.3 fl (ref 78.0–100.0)
MONO ABS: 0.5 10*3/uL (ref 0.1–1.0)
MONOS PCT: 8.4 % (ref 3.0–12.0)
NEUTROS ABS: 3.4 10*3/uL (ref 1.4–7.7)
NEUTROS PCT: 54.4 % (ref 43.0–77.0)
PLATELETS: 164 10*3/uL (ref 150.0–400.0)
RBC: 5.37 Mil/uL (ref 4.22–5.81)
RDW: 13.1 % (ref 11.5–15.5)
WBC: 6.3 10*3/uL (ref 4.0–10.5)

## 2017-06-27 ENCOUNTER — Ambulatory Visit (AMBULATORY_SURGERY_CENTER): Payer: Self-pay

## 2017-06-27 ENCOUNTER — Other Ambulatory Visit: Payer: Self-pay

## 2017-06-27 VITALS — Ht 71.0 in | Wt 190.0 lb

## 2017-06-27 DIAGNOSIS — Z1211 Encounter for screening for malignant neoplasm of colon: Secondary | ICD-10-CM

## 2017-06-27 MED ORDER — NA SULFATE-K SULFATE-MG SULF 17.5-3.13-1.6 GM/177ML PO SOLN: 1.0000 | Freq: Once | ORAL | 0 refills | Status: AC

## 2017-06-27 NOTE — Progress Notes (Signed)
Denies allergies to eggs or soy products. Denies complication of anesthesia or sedation. Denies use of weight loss medication. Denies use of O2.   Emmi instructions declined.  

## 2017-06-28 ENCOUNTER — Encounter (INDEPENDENT_AMBULATORY_CARE_PROVIDER_SITE_OTHER): Payer: Self-pay | Admitting: Orthopedic Surgery

## 2017-06-28 ENCOUNTER — Ambulatory Visit (INDEPENDENT_AMBULATORY_CARE_PROVIDER_SITE_OTHER): Payer: 59 | Admitting: Orthopedic Surgery

## 2017-06-28 ENCOUNTER — Ambulatory Visit (INDEPENDENT_AMBULATORY_CARE_PROVIDER_SITE_OTHER): Payer: Self-pay

## 2017-06-28 ENCOUNTER — Encounter: Payer: Self-pay | Admitting: Internal Medicine

## 2017-06-28 DIAGNOSIS — M19019 Primary osteoarthritis, unspecified shoulder: Secondary | ICD-10-CM

## 2017-06-28 DIAGNOSIS — M25511 Pain in right shoulder: Secondary | ICD-10-CM | POA: Diagnosis not present

## 2017-06-28 DIAGNOSIS — G8929 Other chronic pain: Secondary | ICD-10-CM

## 2017-06-29 ENCOUNTER — Encounter: Payer: Self-pay | Admitting: Internal Medicine

## 2017-07-01 ENCOUNTER — Encounter (INDEPENDENT_AMBULATORY_CARE_PROVIDER_SITE_OTHER): Payer: Self-pay | Admitting: Orthopedic Surgery

## 2017-07-01 NOTE — Progress Notes (Signed)
Office Visit Note   Patient: [name]           Date of Birth: May 02, 1966           MRN: [id number] Visit Date: 06/28/2017 Requested by: Marin Olp, MD [place], [place] [id number] PCP: Marin Olp, MD  Subjective: Chief Complaint  Patient presents with  . 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  Had labral surgery 10 years ago.  Again relatively asymptomatic until 1 year ago.  Radiographs 2 years ago showed early arthritis with inferior humeral head spur. Currently reports right shoulder pain increasing with overhead activity.  The pain will wake him from sleep at night.  Does not lift heavy weights.  He is an active Presenter, broadcasting at a high level.  He is not taking medication for this.            ROS: All systems reviewed are negative as they relate to the chief complaint within the history of present illness.  Patient denies  fevers or chills.   Assessment & Plan: Visit Diagnoses:  1. Chronic right shoulder pain   2. 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  Erosion.  Timing of intervention will be an issue for Jeneen Rinks.  He is young.  He has good rotator cuff strength.  With delaying surgery may increase the amount of deformity he has make the procedure less predictable in terms of its longevity regards to glenoid component stability.  Plan is for MRI to evaluate rotator cuff quantify the amount of posterior glenoid wear.  Follow-up after those studies.  Follow-Up Instructions: No Follow-up on file.   Orders:  Orders Placed This Encounter  Procedures  . XR Shoulder Right  . MR SHOULDER RIGHT W CONTRAST  . Arthrogram   No orders of the defined types were placed in this encounter.     Procedures: No procedures performed   Clinical Data: No additional findings.  Objective: Vital Signs: There were no vitals taken  for this visit.  Physical Exam:   Constitutional: Patient appears well-developed HEENT:  Head: Normocephalic Eyes:EOM are normal Neck: Normal range of motion Cardiovascular: Normal rate Pulmonary/chest: Effort normal Neurologic: Patient is alert Skin: Skin is warm Psychiatric: Patient has normal mood and affect    Ortho Exam: Exam demonstrates good rotator cuff strength isolated infraspinatus supraspinatus and subscap muscle testing.  He does have limited forward flexion to 150 on the right 180 on the left.  There is coarse grinding and popping with range of motion in the shoulder.  Motor sensory function to the hand is intact.  External rotation at 15 degrees of abduction is about 40 on the right compared to 50 on the left  Specialty Comments:  No specialty comments available.  Imaging: No results found.   PMFS History: Patient Active Problem List   Diagnosis Date Noted  . 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 Hamstring tendinitis of right thigh 09/12/2014  . Hyperlipidemia 09/11/2012  . Medial epicondylitis of right elbow 06/30/2011  . Bronchitis with asthma, acute 08/24/2007  . Seasonal and perennial allergic rhinitis 05/16/2007  . Asthma, mild intermittent, well-controlled 05/16/2007   Past Medical History:  Diagnosis Date  . Allergy   . Anxiety   . Asthma   . Bronchopneumonia    Recurrent  . Hyperlipidemia     Family History  Problem Relation Age of Onset  . Healthy Mother   . Hyperlipidemia Father   . Hyperlipidemia Sister   . Healthy Brother   . Allergic rhinitis Daughter   . Heart disease Maternal Grandmother   . Heart disease Maternal Grandfather   . Colon cancer Neg Hx   . Esophageal cancer Neg Hx   . Pancreatic cancer Neg Hx   . Rectal cancer Neg Hx   . Stomach cancer Neg Hx     Past Surgical History:  Procedure Laterality Date  . torn labrum right shoulder  2008  . VASECTOMY  2007   Social History   Occupational History  . 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 Smoking status: Never Smoker  . Smokeless tobacco: Never Used  Substance and Sexual Activity  . Alcohol use: Yes    Alcohol/week: 0.6 oz    Types: 1 Standard drinks or equivalent per week    Comment: Rarely  . Drug use: No  . Sexual activity: Not on file

## 2017-07-03 ENCOUNTER — Other Ambulatory Visit (INDEPENDENT_AMBULATORY_CARE_PROVIDER_SITE_OTHER): Payer: Self-pay

## 2017-07-03 DIAGNOSIS — M19019 Primary osteoarthritis, unspecified shoulder: Secondary | ICD-10-CM

## 2017-07-06 ENCOUNTER — Telehealth: Payer: Self-pay | Admitting: *Deleted

## 2017-07-11 ENCOUNTER — Encounter: Payer: Self-pay | Admitting: Internal Medicine

## 2017-07-11 ENCOUNTER — Other Ambulatory Visit: Payer: Self-pay

## 2017-07-11 ENCOUNTER — Ambulatory Visit (AMBULATORY_SURGERY_CENTER): Payer: 59 | Admitting: Internal Medicine

## 2017-07-11 VITALS — BP 94/62 | HR 52 | Temp 95.7°F | Resp 15 | Ht 71.0 in | Wt 183.0 lb

## 2017-07-11 DIAGNOSIS — D123 Benign neoplasm of transverse colon: Secondary | ICD-10-CM

## 2017-07-11 DIAGNOSIS — Z1211 Encounter for screening for malignant neoplasm of colon: Secondary | ICD-10-CM

## 2017-07-11 DIAGNOSIS — D125 Benign neoplasm of sigmoid colon: Secondary | ICD-10-CM | POA: Diagnosis not present

## 2017-07-11 DIAGNOSIS — Z1212 Encounter for screening for malignant neoplasm of rectum: Secondary | ICD-10-CM

## 2017-07-11 DIAGNOSIS — D122 Benign neoplasm of ascending colon: Secondary | ICD-10-CM

## 2017-07-11 MED ORDER — SODIUM CHLORIDE 0.9 % IV SOLN: 500.0000 mL | Freq: Once | INTRAVENOUS | Status: DC

## 2017-07-11 NOTE — Patient Instructions (Signed)
Discharge instructions given. Handouts on polyps,diverticulosis and hemorrhoids. Resume previous medications. YOU HAD AN ENDOSCOPIC PROCEDURE TODAY AT THE [facility]:   Refer to the procedure report that was given to you for any specific questions about what was found during the examination.  If the procedure report does not answer your questions, please call your gastroenterologist to clarify.  If you requested that your care partner not be given the details of your procedure findings, then the procedure report has been included in a sealed envelope for you to review at your convenience later.  YOU SHOULD EXPECT: Some feelings of bloating in the abdomen. Passage of more gas than usual.  Walking can help get rid of the air that was put into your GI tract during the procedure and reduce the bloating. If you had a lower endoscopy (such as a colonoscopy or flexible sigmoidoscopy) you may notice spotting of blood in your stool or on the toilet paper. If you underwent a bowel prep for your procedure, you may not have a normal bowel movement for a few days.  Please Note:  You might notice some irritation and congestion in your nose or some drainage.  This is from the oxygen used during your procedure.  There is no need for concern and it should clear up in a day or so.  SYMPTOMS TO REPORT IMMEDIATELY:   Following lower endoscopy (colonoscopy or flexible sigmoidoscopy):  Excessive amounts of blood in the stool  Significant tenderness or worsening of abdominal pains  Swelling of the abdomen that is new, acute  Fever of 100F or higher   For urgent or emergent issues, a gastroenterologist can be reached at any hour by calling [phone].   DIET:  We do recommend a small meal at first, but then you may proceed to your regular diet.  Drink plenty of fluids but you should avoid alcoholic beverages for 24 hours.  ACTIVITY:  You should plan to take it easy for the rest of today and you  should NOT DRIVE or use heavy machinery until tomorrow (because of the sedation medicines used during the test).    FOLLOW UP: Our staff will call the number listed on your records the next business day following your procedure to check on you and address any questions or concerns that you may have regarding the information given to you following your procedure. If we do not reach you, we will leave a message.  However, if you are feeling well and you are not experiencing any problems, there is no need to return our call.  We will assume that you have returned to your regular daily activities without incident.  If any biopsies were taken you will be contacted by phone or by letter within the next 1-3 weeks.  Please call us at [phone] if you have not heard about the biopsies in 3 weeks.    SIGNATURES/CONFIDENTIALITY: You and/or your care partner have signed paperwork which will be entered into your electronic medical record.  These signatures attest to the fact that that the information above on your After Visit Summary has been reviewed and is understood.  Full responsibility of the confidentiality of this discharge information lies with you and/or your care-partner. 

## 2017-07-11 NOTE — Progress Notes (Signed)
Called to room to assist during endoscopic procedure.  Patient ID and intended procedure confirmed with present staff. Received instructions for my participation in the procedure from the performing physician.  

## 2017-07-11 NOTE — Op Note (Signed)
[facility] Patient Name: [name] Procedure Date: 07/11/2017 10:00 AM MRN: [id number] Endoscopist: Jerene Bears , MD Age: 52 Referring MD:  Date of Birth: 1966/04/09 Gender: Male Account #: [id number] Procedure:                Colonoscopy Indications:              Screening for colorectal malignant neoplasm, This                            is the patient's first colonoscopy Medicines:                Monitored Anesthesia Care Procedure:                Pre-Anesthesia Assessment:                           - Prior to the procedure, a History and Physical                            was performed, and patient medications and                            allergies were reviewed. The patient's tolerance of                            previous anesthesia was also reviewed. The risks                            and benefits of the procedure and the sedation                            options and risks were discussed with the patient.                            All questions were answered, and informed consent                            was obtained. Prior Anticoagulants: The patient has                            taken no previous anticoagulant or antiplatelet                            agents. ASA Grade Assessment: II - A patient with                            mild systemic disease. After reviewing the risks                            and benefits, the patient was deemed in                            satisfactory condition to undergo the procedure.  After obtaining informed consent, the colonoscope                            was passed under direct vision. Throughout the                            procedure, the patient's blood pressure, pulse, and                            oxygen saturations were monitored continuously. The                            Model CF-HQ190L [id number] scope was introduced                            through the anus and advanced  to the the terminal                            ileum. The colonoscopy was performed without                            difficulty. The patient tolerated the procedure                            well. The quality of the bowel preparation was                            good. The ileocecal valve, appendiceal orifice, and                            rectum were photographed. Scope In: 10:11:46 AM Scope Out: 10:30:08 AM Scope Withdrawal Time: 0 hours 16 minutes 3 seconds  Total Procedure Duration: 0 hours 18 minutes 22 seconds  Findings:                 The digital rectal exam was normal.                           The terminal ileum appeared normal.                           Three sessile polyps were found in the ascending                            colon. The polyps were 5 to 6 mm in size. These                            polyps were removed with a cold snare. Resection                            and retrieval were complete.                           A 5 mm polyp was found in the transverse colon. The  polyp was pedunculated. The polyp was removed with                            a cold snare. Resection and retrieval were complete.                           Three sessile polyps were found in the sigmoid                            colon. The polyps were 4 to 6 mm in size. These                            polyps were removed with a cold snare. Resection                            and retrieval were complete.                           A few small-mouthed diverticula were found in the                            sigmoid colon.                           Internal hemorrhoids were found during                            retroflexion. The hemorrhoids were small. Complications:            No immediate complications. Estimated Blood Loss:     Estimated blood loss was minimal. Impression:               - The examined portion of the ileum was normal.                           -  Three 5 to 6 mm polyps in the ascending colon,                            removed with a cold snare. Resected and retrieved.                           - One 5 mm polyp in the transverse colon, removed                            with a cold snare. Resected and retrieved.                           - Three 4 to 6 mm polyps in the sigmoid colon,                            removed with a cold snare. Resected and retrieved.                           -  Diverticulosis in the sigmoid colon.                           - Small internal hemorrhoids. Recommendation:           - Patient has a contact number available for                            emergencies. The signs and symptoms of potential                            delayed complications were discussed with the                            patient. Return to normal activities tomorrow.                            Written discharge instructions were provided to the                            patient.                           - Resume previous diet.                           - Continue present medications.                           - Await pathology results.                           - Repeat colonoscopy is recommended for                            surveillance. The colonoscopy date will be                            determined after pathology results from today's                            exam become available for review. Jerene Bears, MD 07/11/2017 10:35:54 AM This report has been signed electronically.

## 2017-07-11 NOTE — Progress Notes (Signed)
Pt's states no medical or surgical changes since previsit or office visit. 

## 2017-07-12 ENCOUNTER — Telehealth: Payer: Self-pay | Admitting: *Deleted

## 2017-07-12 ENCOUNTER — Telehealth (INDEPENDENT_AMBULATORY_CARE_PROVIDER_SITE_OTHER): Payer: Self-pay | Admitting: Orthopedic Surgery

## 2017-07-12 NOTE — Telephone Encounter (Signed)
  Follow up Call-  Call back number 07/11/2017  Post procedure Call Back phone  # [phone]  Permission to leave phone message Yes  Some recent data might be hidden     Patient questions:  Do you have a fever, pain , or abdominal swelling? No. Pain Score  0 *  Have you tolerated food without any problems? Yes.    Have you been able to return to your normal activities? Yes.    Do you have any questions about your discharge instructions: Diet   No. Medications  No. Follow up visit  No.  Do you have questions or concerns about your Care? Yes.    Actions: * If pain score is 4 or above: No action needed, pain <4. Patient questioned if he could go back to physical activity and lifting small weights. Discussed with the patient he can return to normal activities. Patient stating he lifts small weights.

## 2017-07-12 NOTE — Telephone Encounter (Signed)
Can you please help me with this? Do you know who may take his insurance and still follow the thin cut protocol that Dr Marlou Sa orders on these scans?

## 2017-07-12 NOTE — Telephone Encounter (Signed)
No answer, left message to call if questions or concerns. 

## 2017-07-12 NOTE — Telephone Encounter (Signed)
Please call pt to discuss CT scan. Pt stated [facility] no longer takes [place]. Pt has appt on 07/20/17 he would like to know where can he can complete his CT scan due to his insurance.    [place] ID [id number]

## 2017-07-13 NOTE — Telephone Encounter (Signed)
I called [facility] central Scheduling, they are going to check to see if in network and schedule with Pt.

## 2017-07-14 ENCOUNTER — Encounter: Payer: Self-pay | Admitting: Internal Medicine

## 2017-07-18 ENCOUNTER — Other Ambulatory Visit: Payer: 59

## 2017-07-18 ENCOUNTER — Inpatient Hospital Stay: Admission: RE | Admit: 2017-07-18 | Payer: 59 | Source: Ambulatory Visit

## 2017-07-19 ENCOUNTER — Ambulatory Visit (HOSPITAL_COMMUNITY)
Admission: RE | Admit: 2017-07-19 | Discharge: 2017-07-19 | Disposition: A | Discharge status: Home / Self Care | Payer: 59 | Source: Ambulatory Visit | Attending: Orthopedic Surgery | Admitting: Orthopedic Surgery

## 2017-07-19 DIAGNOSIS — M19019 Primary osteoarthritis, unspecified shoulder: Secondary | ICD-10-CM

## 2017-07-19 DIAGNOSIS — M19011 Primary osteoarthritis, right shoulder: Secondary | ICD-10-CM | POA: Diagnosis not present

## 2017-07-19 DIAGNOSIS — M24011 Loose body in right shoulder: Secondary | ICD-10-CM | POA: Diagnosis not present

## 2017-07-19 DIAGNOSIS — M25411 Effusion, right shoulder: Secondary | ICD-10-CM | POA: Insufficient documentation

## 2017-07-20 ENCOUNTER — Ambulatory Visit (INDEPENDENT_AMBULATORY_CARE_PROVIDER_SITE_OTHER): Payer: 59 | Admitting: Orthopedic Surgery

## 2017-07-26 ENCOUNTER — Ambulatory Visit (INDEPENDENT_AMBULATORY_CARE_PROVIDER_SITE_OTHER): Payer: 59 | Admitting: Orthopedic Surgery

## 2017-07-31 ENCOUNTER — Ambulatory Visit (HOSPITAL_COMMUNITY): Payer: 59

## 2018-02-20 NOTE — Telephone Encounter (Signed)
Entered in error. SM 

## 2018-04-16 NOTE — Patient Instructions (Addendum)
Health Maintenance Due  Topic Date Due  . INFLUENZA VACCINE - please get [place] the date of your flu shot through [facility]- greatly appreciate it  01/04/2018   Read about/think about SHingrix for shingles prevention- can consider in future year  Thanks for doing labs

## 2018-04-16 NOTE — Progress Notes (Signed)
Phone: [phone]  Subjective:  Patient presents today for their annual physical. Chief complaint-noted.   See problem oriented charting- ROS- full  review of systems was completed and negative except for: right shoulder pain (working with Dr. Marlou Sa- will need shoulder replacement eventually)  The following were reviewed and entered/updated in epic: Past Medical History:  Diagnosis Date  . Allergy   . Anxiety   . Asthma   . Bronchopneumonia    Recurrent  . Hyperlipidemia    Patient Active Problem List   Diagnosis Date Noted  . Hyperlipidemia 09/11/2012    Priority: Medium  . Bronchitis with asthma, acute 08/24/2007    Priority: Medium  . Asthma, mild intermittent, well-controlled 05/16/2007    Priority: Medium  . 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 Hamstring tendinitis of right thigh 09/12/2014    Priority: Low  . Medial epicondylitis of right elbow 06/30/2011    Priority: Low  . Seasonal and perennial allergic rhinitis 05/16/2007    Priority: Low   Past Surgical History:  Procedure Laterality Date  . torn labrum right shoulder  2008  . VASECTOMY  2007    Family History  Problem Relation Age of Onset  . Healthy Mother   . Hyperlipidemia Father   . Hyperlipidemia Sister   . Healthy Brother   . Allergic rhinitis Daughter   . Heart disease Maternal Grandmother   . Heart disease Maternal Grandfather   . Colon cancer Neg Hx   . Esophageal cancer Neg Hx   . Pancreatic cancer Neg Hx   . Rectal cancer Neg Hx   . Stomach cancer Neg Hx     Medications- reviewed and updated Current Outpatient Medications  Medication Sig Dispense Refill  . albuterol (PROVENTIL HFA;VENTOLIN HFA) 108 (90 Base) MCG/ACT inhaler Inhale 2 puffs into the lungs every 6 (six) hours as needed for wheezing or shortness of breath (rescue inhaler). 1 Inhaler 3  . aspirin EC 81 MG tablet Take 81 mg by mouth daily.    Marland Kitchen atorvastatin (LIPITOR) 40 MG tablet Take 1 tablet (40 mg  total) by mouth daily. 90 tablet 3  . finasteride (PROSCAR) 5 MG tablet TAKE 1/4 TABLET BY MOUTH DAILY 25 tablet 3  . Fluticasone-Salmeterol 55-14 MCG/ACT AEPB Inhale 1 puff into the lungs 2 (two) times daily. 3 each 3  . montelukast (SINGULAIR) 10 MG tablet TAKE 1 TABLET AT BEDTIME 90 tablet 3   No current facility-administered medications for this visit.     Allergies-reviewed and updated Allergies  Allergen Reactions  . Minocycline Rash    Erythema Multiforme    Social History   Social History Narrative   Married. 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 Worked in [place] for 7 years before that. [employer] for 5 years.    Father is retired Development worker, international aid is physician (endocrinologist)      Hobbies: tennis and raquetball, time with kids and family, church- [employer]       Objective: BP 104/70 (BP Location: Left Arm, Patient Position: Sitting, Cuff Size: Normal)   Pulse 66   Temp 97.9 F (36.6 C) (Oral)   Ht 5\' 11"  (1.803 m)   Wt 185 lb 9.6 oz (84.2 kg)   SpO2 98%   BMI 25.89 kg/m  Gen: NAD, resting comfortably HEENT: Mucous membranes are moist. Oropharynx normal Neck: no thyromegaly CV: RRR  no murmurs rubs or gallops Lungs: CTAB no crackles, wheeze, rhonchi Abdomen: soft/nontender/nondistended/normal bowel sounds. No rebound or guarding.  Ext: no edema Skin: warm, dry, a few small lipomas- one on right forearm- patient states not changing Neuro: grossly normal, moves all extremities, PERRLA   Assessment/Plan:  52 y.o. male presenting for annual physical.  Health Maintenance counseling: 1. Anticipatory guidance: Patient counseled regarding regular dental exams -q6 months (he actually goes q9 months), eye exams - no regular issues other than readers,  avoiding smoking and second hand smoke, limiting alcohol to 2 beverages per day - 2 per year.   2. Risk factor reduction:   Advised patient of need for regular exercise and diet rich and fruits and vegetables to reduce risk of heart attack and stroke. Exercise- staying very active with over 150 minutes a week between tennis or raquetball and plus walking. Diet- likes to stay in 180s- this is reasonable- balanced diet.  Wt Readings from Last 3 Encounters:  04/17/18 185 lb 9.6 oz (84.2 kg)  07/11/17 183 lb (83 kg)  06/27/17 190 lb (86.2 kg)  3. Immunizations/screenings/ancillary studies- flu shot through work- will send [place] the date. Discussed shingrix.  Immunization History  Administered Date(s) Administered  . Influenza Split 03/07/2011, 05/17/2012  . Influenza Whole 02/22/2008, 03/03/2009  . Influenza,inj,Quad PF,6+ Mos 03/25/2013, 05/08/2014, 04/13/2015  . Influenza-Unspecified 04/07/2016, 03/31/2017  . Tdap 03/25/2013  4. Prostate cancer screening-  trending PSAs since on finasteride which can theoretically increase high grade cancer risk. He is on for hair loss. Update psa. Can hold off on rectal unless trending up signficantly.  Lab Results  Component Value Date   PSA 0.19 04/17/2017   PSA 0.34 04/06/2016   5. Colon cancer screening - 07/11/17 with 3 year repeat due to adenomatous polyp 6. Skin cancer screening- no regular dermatologist- about every 5 years. advised regular sunscreen use. Denies worrisome, changing, or new skin lesions.  7. never smoker  Status of chronic or acute concerns   Working with Dr. Belia Heman monthly. Eventually will need shoulder replacement On finasteride for male pattern baldness- 1/4th of 5 mg tablet daily.   Asthma- former Dr. Annamaria Boots patient. on fluticasone- salmeterol BID and Singulair. Rare albuterol use perhaps 2-3 x a year, now up to about 6x a year or more as needs it when he gets sick.   Hyperlipidemia- possible [place] in regards to high bilirubin. Wants to target LDL goal under 70 with prior elevated cardiac CT scoring. Has seen Dr. Aundra Dubin intermittently in the past.  Remains on atorvastatin 40mg   1 year CPE  Lab/Order associations: Preventative health care - Plan: CBC, Comprehensive metabolic panel, Lipid panel, PSA, PSA, Lipid panel, Comprehensive metabolic panel, CBC  Hyperlipidemia, unspecified hyperlipidemia type - Plan: CBC, Comprehensive metabolic panel, Lipid panel, Lipid panel, Comprehensive metabolic panel, CBC  Screening for prostate cancer - Plan: PSA, PSA  Asthma in adult, mild intermittent, uncomplicated - Plan: montelukast (SINGULAIR) 10 MG tablet  Meds ordered this encounter  Medications  . albuterol (PROVENTIL HFA;VENTOLIN HFA) 108 (90 Base) MCG/ACT inhaler    Sig: Inhale 2 puffs into the lungs every 6 (six) hours as needed for wheezing or shortness of breath (rescue inhaler).    Dispense:  1 Inhaler    Refill:  3  . atorvastatin (LIPITOR) 40 MG tablet    Sig: Take 1 tablet (40 mg total) by mouth daily.    Dispense:  90 tablet    Refill:  3  . finasteride (PROSCAR) 5  MG tablet    Sig: TAKE 1/4 TABLET BY MOUTH DAILY    Dispense:  25 tablet    Refill:  3  . Fluticasone-Salmeterol 55-14 MCG/ACT AEPB    Sig: Inhale 1 puff into the lungs 2 (two) times daily.    Dispense:  3 each    Refill:  3  . montelukast (SINGULAIR) 10 MG tablet    Sig: TAKE 1 TABLET AT BEDTIME    Dispense:  90 tablet    Refill:  3   Return precautions advised.  Garret Reddish, MD

## 2018-04-17 ENCOUNTER — Encounter: Payer: Self-pay | Admitting: Family Medicine

## 2018-04-17 ENCOUNTER — Ambulatory Visit (INDEPENDENT_AMBULATORY_CARE_PROVIDER_SITE_OTHER): Payer: 59 | Admitting: Family Medicine

## 2018-04-17 VITALS — BP 104/70 | HR 66 | Temp 97.9°F | Ht 71.0 in | Wt 185.6 lb

## 2018-04-17 DIAGNOSIS — E785 Hyperlipidemia, unspecified: Secondary | ICD-10-CM

## 2018-04-17 DIAGNOSIS — J4520 Mild intermittent asthma, uncomplicated: Secondary | ICD-10-CM | POA: Diagnosis not present

## 2018-04-17 DIAGNOSIS — Z125 Encounter for screening for malignant neoplasm of prostate: Secondary | ICD-10-CM | POA: Diagnosis not present

## 2018-04-17 DIAGNOSIS — Z0000 Encounter for general adult medical examination without abnormal findings: Secondary | ICD-10-CM | POA: Diagnosis not present

## 2018-04-17 LAB — LIPID PANEL
CHOL/HDL RATIO: 3
Cholesterol: 94 mg/dL (ref 0–200)
HDL: 36.1 mg/dL — ABNORMAL LOW (ref >39.00)
LDL Cholesterol: 51 mg/dL (ref 0–99)
NonHDL: 58.21
TRIGLYCERIDES: 37 mg/dL (ref 0.0–149.0)
VLDL: 7.4 mg/dL (ref 0.0–40.0)

## 2018-04-17 LAB — COMPREHENSIVE METABOLIC PANEL
ALT: 18 U/L (ref 0–53)
AST: 14 U/L (ref 0–37)
Albumin: 4.2 g/dL (ref 3.5–5.2)
Alkaline Phosphatase: 58 U/L (ref 39–117)
BILIRUBIN TOTAL: 1.4 mg/dL — AB (ref 0.2–1.2)
BUN: 25 mg/dL — AB (ref 6–23)
CALCIUM: 9.2 mg/dL (ref 8.4–10.5)
CHLORIDE: 103 meq/L (ref 96–112)
CO2: 29 meq/L (ref 19–32)
Creatinine, Ser: 0.96 mg/dL (ref 0.40–1.50)
GFR: 87.4 mL/min (ref >60.00)
GLUCOSE: 91 mg/dL (ref 70–99)
Potassium: 4 mEq/L (ref 3.5–5.1)
SODIUM: 139 meq/L (ref 135–145)
Total Protein: 6.6 g/dL (ref 6.0–8.3)

## 2018-04-17 LAB — PSA: PSA: 0.18 ng/mL (ref 0.10–4.00)

## 2018-04-17 LAB — CBC
HEMATOCRIT: 48.2 % (ref 39.0–52.0)
Hemoglobin: 16.5 g/dL (ref 13.0–17.0)
MCHC: 34.2 g/dL (ref 30.0–36.0)
MCV: 92 fl (ref 78.0–100.0)
Platelets: 152 10*3/uL (ref 150.0–400.0)
RBC: 5.24 Mil/uL (ref 4.22–5.81)
RDW: 13.2 % (ref 11.5–15.5)
WBC: 5.8 10*3/uL (ref 4.0–10.5)

## 2018-04-17 MED ORDER — ATORVASTATIN CALCIUM 40 MG PO TABS: 40.0000 mg | ORAL_TABLET | Freq: Every day | ORAL | 3 refills | Status: DC

## 2018-04-17 MED ORDER — FINASTERIDE 5 MG PO TABS: ORAL_TABLET | ORAL | 3 refills | Status: DC

## 2018-04-17 MED ORDER — MONTELUKAST SODIUM 10 MG PO TABS: ORAL_TABLET | ORAL | 3 refills | Status: DC

## 2018-04-17 MED ORDER — FLUTICASONE-SALMETEROL 55-14 MCG/ACT IN AEPB: 1.0000 | INHALATION_SPRAY | Freq: Two times a day (BID) | RESPIRATORY_TRACT | 3 refills | Status: DC

## 2018-04-17 MED ORDER — ALBUTEROL SULFATE HFA 108 (90 BASE) MCG/ACT IN AERS: 2.0000 | INHALATION_SPRAY | Freq: Four times a day (QID) | RESPIRATORY_TRACT | 3 refills | Status: DC | PRN

## 2018-06-15 ENCOUNTER — Encounter (INDEPENDENT_AMBULATORY_CARE_PROVIDER_SITE_OTHER): Payer: Self-pay | Admitting: Radiology

## 2018-06-16 NOTE — Progress Notes (Signed)
I saw [name] E after hours in the clinic on Saturday.  Is having some medial sided knee pain.  This looks like pes bursa tendinitis.  Ultrasound-guided injection into the pes bursa performed with 1/3 cc Marcaine 1/3 cc Depo-Medrol.  I want him to come in to clinic in 5 days for clinical recheck and repeat assessment.  I informed him of same.

## 2018-06-18 ENCOUNTER — Telehealth: Payer: Self-pay | Admitting: Family Medicine

## 2018-06-18 NOTE — Telephone Encounter (Signed)
See note

## 2018-06-18 NOTE — Telephone Encounter (Signed)
Routed to wrong practice. Sending to [facility].

## 2018-06-18 NOTE — Telephone Encounter (Signed)
Copied from [place] [id number]. Topic: Quick Communication - See Telephone Encounter >> Jun 18, 2018 12:57 PM Blase Mess A wrote: CRM for notification. See Telephone encounter for: 06/18/18.  Patient is requesting an order for a TB skin test. Or he does not know if he has had one in the past. Please advise He also has a Health Examination Certificate that he would like to drop it off Please advise [phone]

## 2018-06-20 NOTE — Telephone Encounter (Signed)
Called patient and left a voicemail message advising patient to call and schedule a nurse visit for the PPD and he can bring the form to the appointment

## 2018-06-27 ENCOUNTER — Ambulatory Visit (INDEPENDENT_AMBULATORY_CARE_PROVIDER_SITE_OTHER): Payer: 59

## 2018-06-27 DIAGNOSIS — Z111 Encounter for screening for respiratory tuberculosis: Secondary | ICD-10-CM

## 2018-06-29 ENCOUNTER — Ambulatory Visit: Payer: 59

## 2018-06-29 DIAGNOSIS — Z111 Encounter for screening for respiratory tuberculosis: Secondary | ICD-10-CM

## 2018-06-29 NOTE — Patient Instructions (Signed)
There are no preventive care reminders to display for this patient.  Depression screen [facility] 2/9 04/17/2018 04/17/2017 01/06/2015  Decreased Interest 0 0 0  Down, Depressed, Hopeless 0 0 0  PHQ - 2 Score 0 0 0

## 2018-06-29 NOTE — Progress Notes (Signed)
I have reviewed the patient's encounter and agree with the documentation.  Algis Greenhouse. Jerline Pain, MD 06/29/2018 4:52 PM

## 2018-06-29 NOTE — Progress Notes (Signed)
Patient here for PPD Reading. Negative induration. Paperwork completed and provided to patient

## 2018-10-22 ENCOUNTER — Telehealth: Payer: Self-pay | Admitting: Family Medicine

## 2018-10-22 NOTE — Telephone Encounter (Signed)
Patient calling in to see if Dr.Hunter would see him in office patient stated that he thinks he might have zoster. Patient did a virtual visit with his brother that is a doctor and said that it did look like zoster so he would just like Dr Yong Channel to look at in person. Please advise patient would like a call back at [phone].

## 2018-10-22 NOTE — Telephone Encounter (Signed)
Called pt and confirmed no F/C/C over the past 48 hours. Scheduled patient for in office visit tomorrow at 2:20 PM.

## 2018-10-23 ENCOUNTER — Encounter: Payer: Self-pay | Admitting: Family Medicine

## 2018-10-23 ENCOUNTER — Ambulatory Visit: Payer: Self-pay | Admitting: Family Medicine

## 2018-10-23 ENCOUNTER — Other Ambulatory Visit: Payer: Self-pay

## 2018-10-23 VITALS — BP 113/72 | HR 51 | Temp 97.8°F | Ht 71.0 in | Wt 183.6 lb

## 2018-10-23 DIAGNOSIS — B029 Zoster without complications: Secondary | ICD-10-CM

## 2018-10-23 NOTE — Progress Notes (Signed)
Phone [phone]   Subjective:  [name] is a 53 y.o. year old very pleasant male patient who presents for/with See problem oriented charting Chief Complaint  Patient presents with  . Herpes Zoster   ROS- No fever, chills, cough, shortness of breath, body aches, sore throat, or loss of taste or smell   Past Medical History-  Patient Active Problem List   Diagnosis Date Noted  . Hyperlipidemia 09/11/2012    Priority: Medium  . Bronchitis with asthma, acute 08/24/2007    Priority: Medium  . Asthma, mild intermittent, well-controlled 05/16/2007    Priority: Medium  . 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 Hamstring tendinitis of right thigh 09/12/2014    Priority: Low  . Medial epicondylitis of right elbow 06/30/2011    Priority: Low  . Seasonal and perennial allergic rhinitis 05/16/2007    Priority: Low    Medications- reviewed and updated Current Outpatient Medications  Medication Sig Dispense Refill  . albuterol (PROVENTIL HFA;VENTOLIN HFA) 108 (90 Base) MCG/ACT inhaler Inhale 2 puffs into the lungs every 6 (six) hours as needed for wheezing or shortness of breath (rescue inhaler). 1 Inhaler 3  . aspirin EC 81 MG tablet Take 81 mg by mouth daily.    Marland Kitchen atorvastatin (LIPITOR) 40 MG tablet Take 1 tablet (40 mg total) by mouth daily. 90 tablet 3  . finasteride (PROSCAR) 5 MG tablet TAKE 1/4 TABLET BY MOUTH DAILY 25 tablet 3  . Fluticasone-Salmeterol 55-14 MCG/ACT AEPB Inhale 1 puff into the lungs 2 (two) times daily. 3 each 3  . montelukast (SINGULAIR) 10 MG tablet TAKE 1 TABLET AT BEDTIME 90 tablet 3   No current facility-administered medications for this visit.      Objective:  BP 113/72   Pulse (!) 51   Temp 97.8 F (36.6 C) (Oral)   Ht 5\' 11"  (1.803 m)   Wt 183 lb 9.6 oz (83.3 kg)   BMI 25.61 kg/m  Gen: NAD, resting comfortably CV: RRR no murmurs rubs or gallops Lungs: CTAB no crackles, wheeze, rhonchi Abdomen:  soft/nontender/nondistended/normal bowel sounds. Ext: no edema Skin: warm, dry, on right abdomen he has a patch of erythematous papules- has similar finding in dermatomal distribution onto right back but does not cross midline    Assessment and Plan    Herpes zoster without complication  S: Patient on Thursday evening noted bloated stomach, stomach fullness, felt uncomfortable in right stomach.  Thought perhaps had not eaten well as he had some cookies/chips and salsa that day.  That night had sharp pains on the right side near his rib cage.  Did not sleep well that evening.  Pain continued in the next day with a constant dull pain on his right side.  He got some Prilosec in case this was related to an ulcer.  Once again did not sleep well.  By Angie Fava was concerned about having gallstones as his sister has had similar symptoms.  He described the pain like someone hitting him in his rib cage with radiation of pain around to the back.  On Sunday developed a rash on his front right side and called his brother who is an endocrinologist.  By Monday rash extended to the back in the same band of pain.  Patient has tried ibuprofen for pain management but it made him extremely itchy.  He is using melatonin low-dose along with Benadryl to help with sleep.  He read about shingles and has decided to cut  out caffeine, sweets, chocolate, chips-had only been doing one 6-8 ounce Coke 0 daily anyway.  He is avoiding exercising as when he gets sweaty it seems to irritate the area.  At least moderate level pain when this started.  Patient does admit to increased stress-over the last few years has had 30% decrease in per commission amount he receives including a recent 5% cut.  He also has been stressed as his daughter has had to make a difficult decision-really wants to play volleyball for Division I school and get playing time so she is going to end up at [employer] school-I believe [employer] over  going to [employer].  She ultimately wants to go to med school so he is concerned this may decrease her chances potentially.  As far as treatment for herpes zoster-brother sent in Valtrex 1 tablet 3 times per day at 1000 mg.  He started this on Sunday at noon.  He also started gabapentin 300 mg 1 capsule 3 times per day.  He is also on aspirin 650 mg 4 times per day. A/P: I agree with diagnosis of herpes zoster.  He is already on appropriate treatment with Valtrex and on gabapentin for pain.  We discussed pulling Tylenol off first if he feels like pain is controlled over the coming weeks-then can titrate down by 1 gabapentin every week or 2.  He asks about a steroid Dosepak-we reviewed literature from up-to-date stating meta-analysis did not show benefit of steroids so we opted to defer this.  We did previously offer shingles vaccination at his physical- looking back he wishes he had received this.  Can consider at future physical  Future Appointments  Date Time Provider [place]  04/23/2019  8:20 AM Marin Olp, MD [facility]   Lab/Order associations: Herpes zoster without complication  Return precautions advised.  Garret Reddish, MD

## 2018-10-23 NOTE — Patient Instructions (Addendum)
There are no preventive care reminders to display for this patient.  Depression screen [facility] 2/9 10/23/2018 04/17/2018 04/17/2017  Decreased Interest 0 0 0  Down, Depressed, Hopeless 0 0 0  PHQ - 2 Score 0 0 0   In person visit

## 2019-02-01 IMAGING — CT CT SHOULDER*R* W/O CM
2 series · 15 of 20 positions shown, 18 images · non-contrast
Comparison: Right shoulder x-rays dated June 28, 2017.

CLINICAL DATA: Chronic right shoulder pain. Possible shoulder
replacement.

EXAM:
CT OF THE UPPER RIGHT EXTREMITY WITHOUT CONTRAST
TECHNIQUE: Multidetector CT imaging of the upper right extremity was performed
according to the standard protocol.

[Series 7: ax st · axial · 0.51mm/px · z∈[+1670,+1795]mm · 12 of 246 slices shown, 15 images]
[im 19/246  soft-tissue]
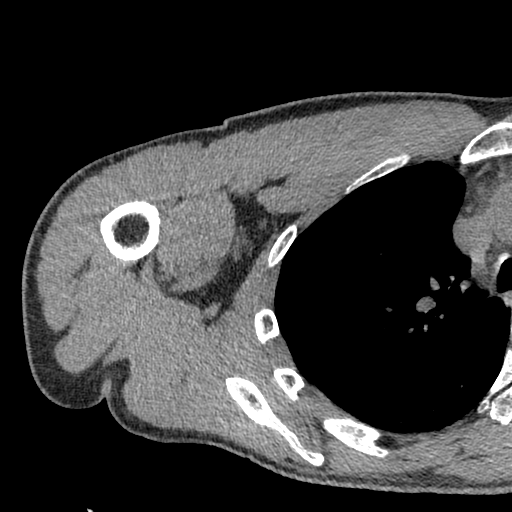
[im 19/246  bone]
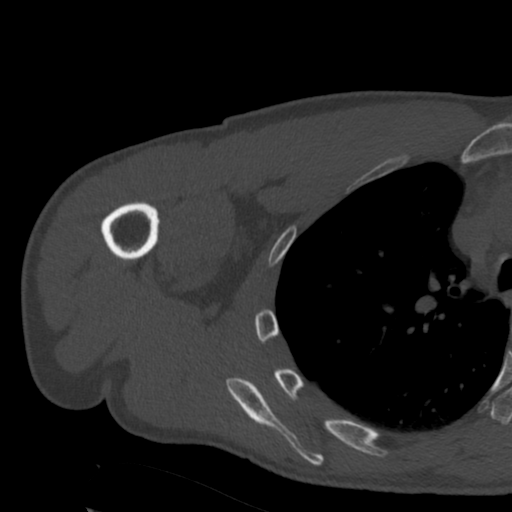
[im 38/246  bone]
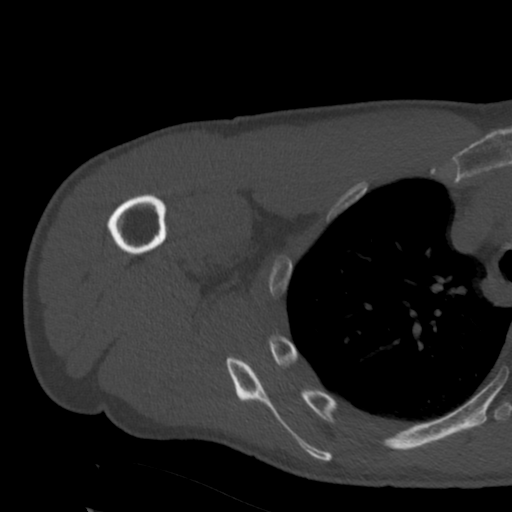
[im 57/246  bone]
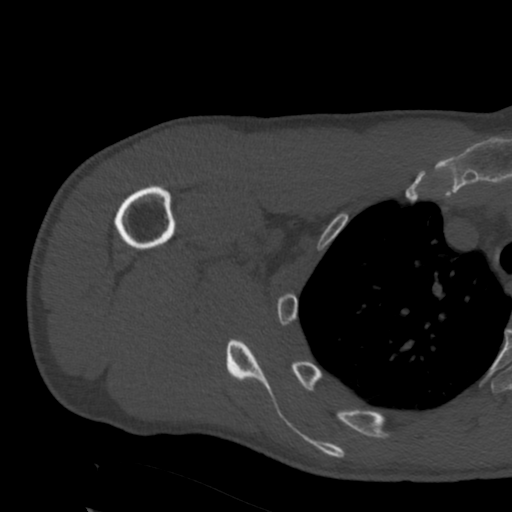
[im 76/246  bone]
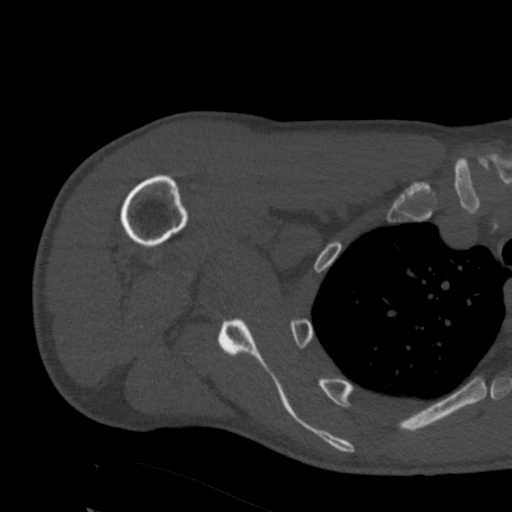
[im 95/246  soft-tissue]
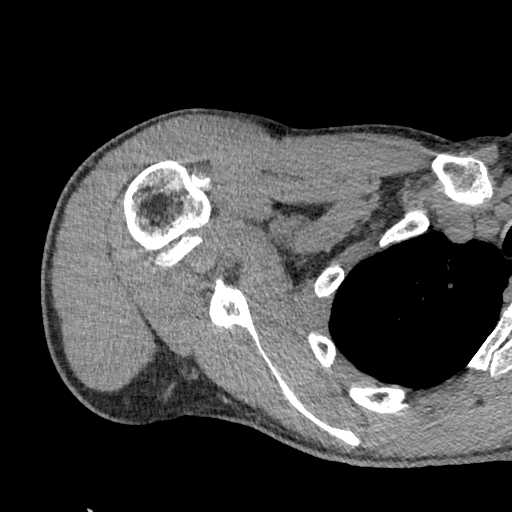
[im 95/246  bone]
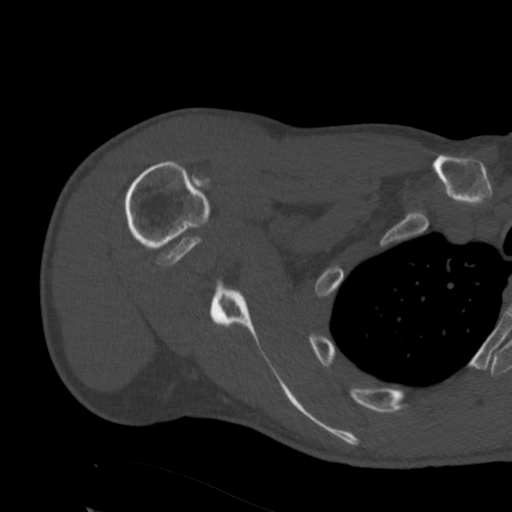
[im 114/246  bone]
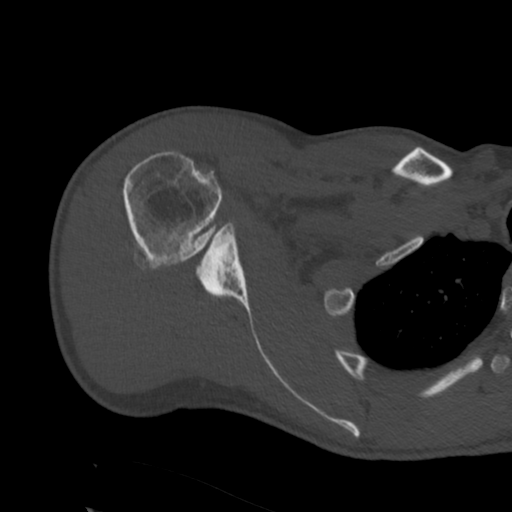
[im 132/246  bone]
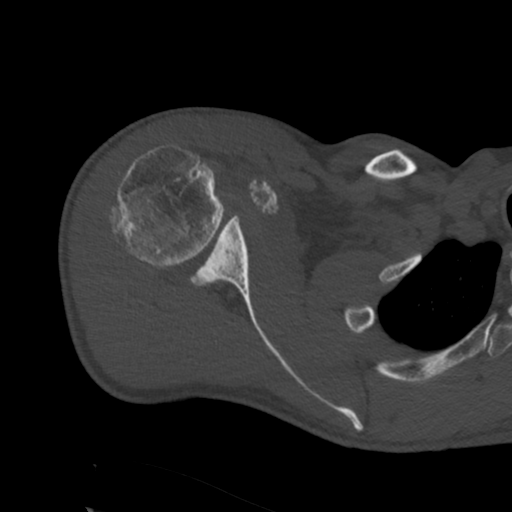
[im 151/246  bone]
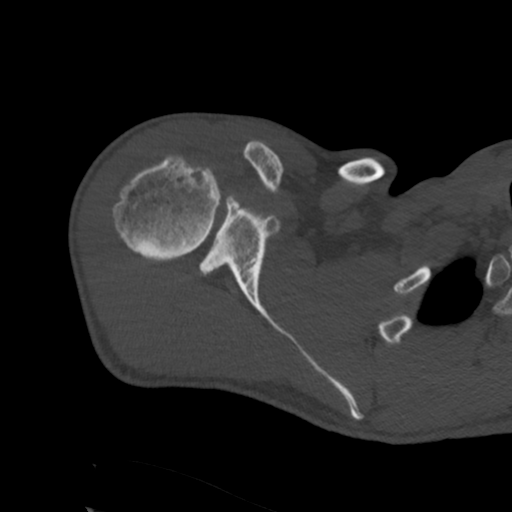
[im 170/246  soft-tissue]
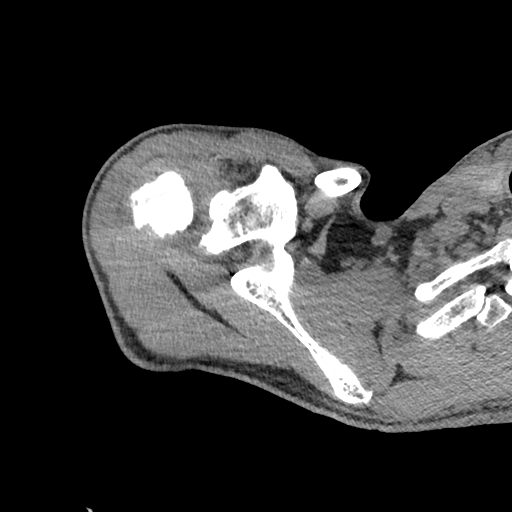
[im 170/246  bone]
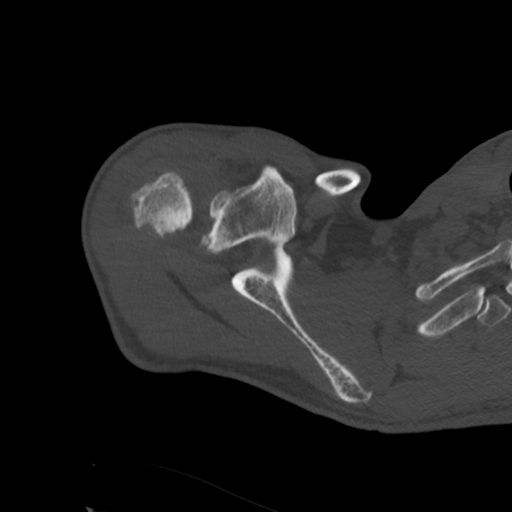
[im 189/246  bone]
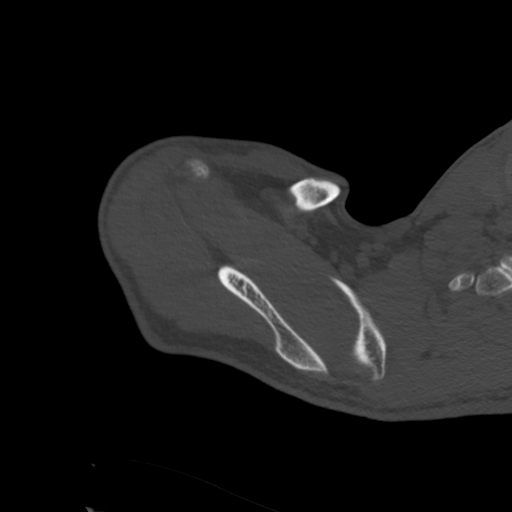
[im 208/246  bone]
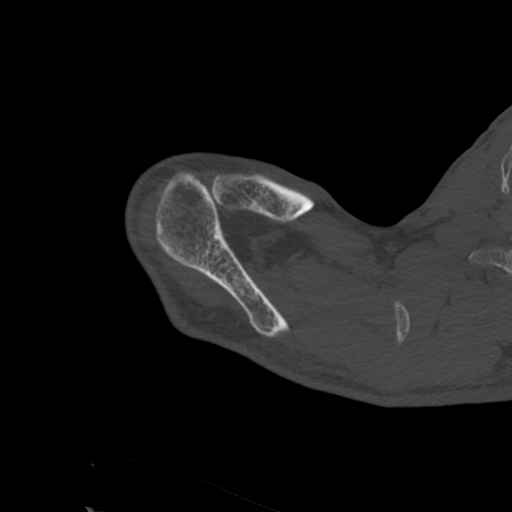
[im 227/246  bone]
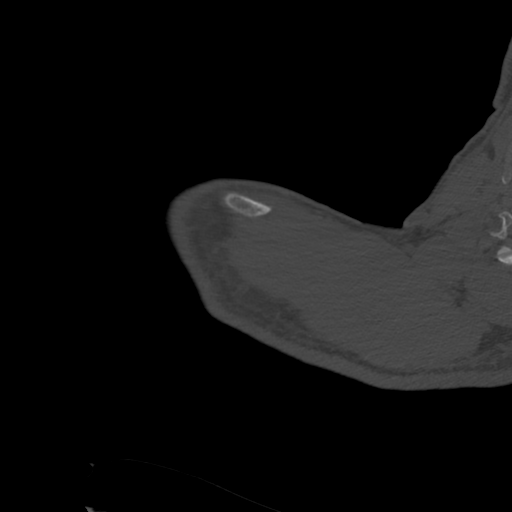

[Series 8: cor st · coronal · 0.31mm/px · 3 of 317 slices shown]
[im 64/317  bone]
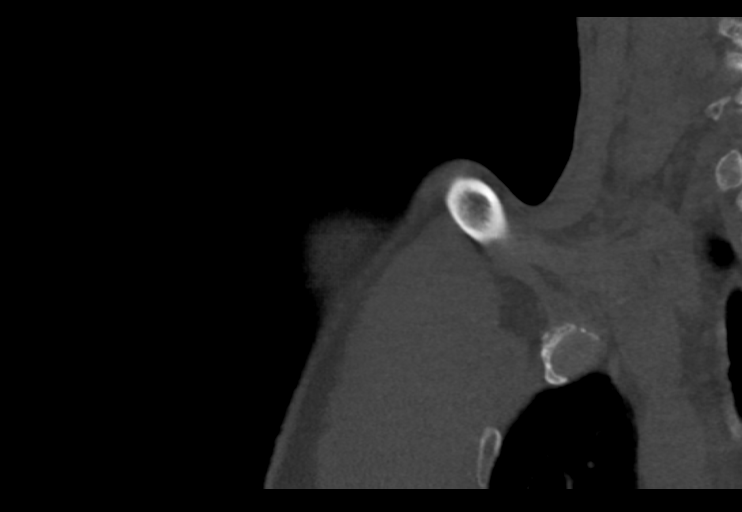
[im 127/317  bone]
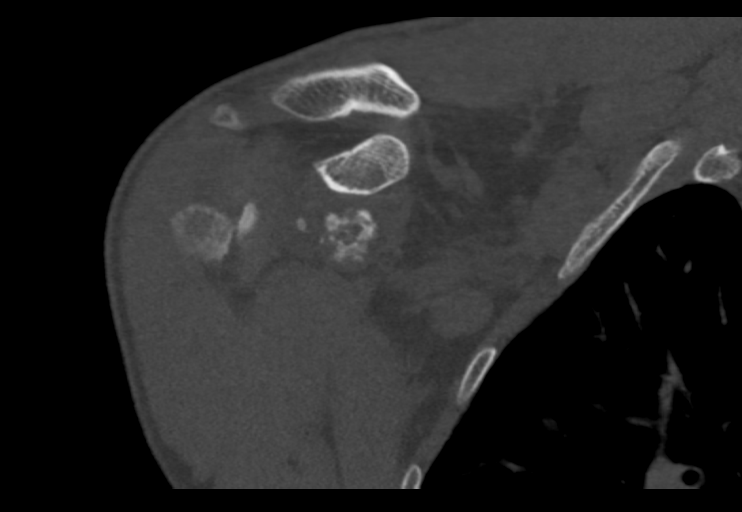
[im 190/317  bone]
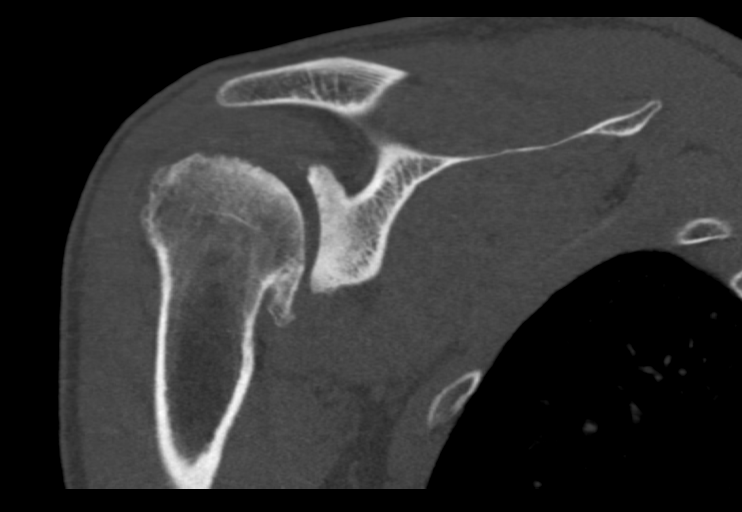

[15 of 20 positions shown; findings below may reference images not displayed]

FINDINGS: Bones/Joint/Cartilage

No fracture or dislocation.

Severe osteoarthritis of the glenohumeral joint with severe joint
space narrowing, bone-on-bone appearance, subchondral sclerosis,
subchondral cystic changes and marginal osteophytosis. Small
glenohumeral joint effusion with loose bodies in the subscapularis
recess.

Minimal arthropathy of the acromioclavicular joint. Type II
acromion.

Ligaments

Ligaments are suboptimally evaluated by CT.

Muscles and Tendons
Muscles are normal.  No muscle atrophy.

Soft tissue
No fluid collection or hematoma. No soft tissue mass. The visualized
right lung is clear.
IMPRESSION: 1. Severe glenohumeral osteoarthritis. Small joint effusion with
several loose bodies in the subscapularis recess.

## 2019-02-21 ENCOUNTER — Other Ambulatory Visit: Payer: Self-pay

## 2019-02-21 DIAGNOSIS — Z20822 Contact with and (suspected) exposure to covid-19: Secondary | ICD-10-CM

## 2019-02-23 LAB — NOVEL CORONAVIRUS, NAA: SARS-CoV-2, NAA: NOT DETECTED

## 2019-04-01 ENCOUNTER — Other Ambulatory Visit: Payer: Self-pay

## 2019-04-01 DIAGNOSIS — Z20822 Contact with and (suspected) exposure to covid-19: Secondary | ICD-10-CM

## 2019-04-03 LAB — NOVEL CORONAVIRUS, NAA: SARS-CoV-2, NAA: NOT DETECTED

## 2019-04-22 ENCOUNTER — Other Ambulatory Visit: Payer: Self-pay | Admitting: Family Medicine

## 2019-04-22 ENCOUNTER — Other Ambulatory Visit: Payer: Self-pay | Admitting: *Deleted

## 2019-04-22 DIAGNOSIS — Z20822 Contact with and (suspected) exposure to covid-19: Secondary | ICD-10-CM

## 2019-04-22 DIAGNOSIS — J4520 Mild intermittent asthma, uncomplicated: Secondary | ICD-10-CM

## 2019-04-22 MED ORDER — MONTELUKAST SODIUM 10 MG PO TABS: ORAL_TABLET | ORAL | 0 refills | Status: DC

## 2019-04-22 MED ORDER — ATORVASTATIN CALCIUM 40 MG PO TABS: 40.0000 mg | ORAL_TABLET | Freq: Every day | ORAL | 0 refills | Status: DC

## 2019-04-22 NOTE — Telephone Encounter (Signed)
Medication Refill - Medication: montelukast (SINGULAIR) 10 MG tablet,   atorvastatin (LIPITOR) 40 MG tablet     Has the patient contacted their pharmacy? Yes.   (Agent: If no, request that the patient contact the pharmacy for the refill.) (Agent: If yes, when and what did the pharmacy advise?)  Preferred Pharmacy (with phone number or street name): [facility] [address], [place] - [place] [phone] (Phone) [phone] (Fax)   PT HAD TO RESCHEDULE HIS PHYSICAL APPOINTMENT DUE TO HIS COLD SYMPTOMS. HE WILL BE OUT OF HIM MEDICATION BEFORE HIS APPOINTMENT  Agent: Please be advised that RX refills may take up to 3 business days. We ask that you follow-up with your pharmacy.

## 2019-04-23 ENCOUNTER — Encounter: Payer: 59 | Admitting: Family Medicine

## 2019-04-24 ENCOUNTER — Telehealth: Payer: Self-pay | Admitting: Family Medicine

## 2019-04-24 LAB — NOVEL CORONAVIRUS, NAA: SARS-CoV-2, NAA: DETECTED — AB

## 2019-04-24 NOTE — Telephone Encounter (Signed)
Called and lm on pt vm tcb regarding below question.

## 2019-04-24 NOTE — Telephone Encounter (Signed)
Called patient reviewed all information will call if any questions.

## 2019-04-24 NOTE — Telephone Encounter (Signed)
Copied from [place] [id number]. Topic: General - Inquiry >> Apr 24, 2019 10:47 AM [name] wrote: Reason for CRM: Patient calling letting PCP know he was tested positive for covid, patient does have asthma and is wondering if he should do anything further to prevent his breathing getting worse. Patient states as of right now it is a bad cold.  Call back [phone]   I called pt to schedule a virtual visit with Dr. Yong Channel to discuss. Patient said he is feeling just fine, he just has a question for the nurse/CMA.  If you will, please call pt back at [phone] to answer his question. Thank you!!

## 2019-04-24 NOTE — Telephone Encounter (Signed)
Called patient [name] is doing ok. Feeling much [name] does feel a little tightness in chest but not any more than if [name] had a regular cold. [name] does not have a productive cough. Only other symptom is increased fatigue. I reviewed all red words and told him if [name] has any increased symptoms to seek treatment immediately. [name] is self isolating now. Let him know I would send to Dr. Yong Channel to see if any further action needed.

## 2019-04-24 NOTE — Telephone Encounter (Signed)
I would have him purchase a pulse oximeter-actually have someone purchase one for him and bring to his house.  If oxygen levels get to 94% or below consistently would go to the hospital even if he does not feel poorly to be proactive if he does begin to wheeze/have asthma exacerbation-I would be willing to call in prednisone but it would make sense in that case to do a video visit.  Should focus on lots of rest and hydration.  He should self quarantine for minimum of 10 days if respiratory symptoms have resolved for at least 24 hours at that point.

## 2019-05-03 ENCOUNTER — Other Ambulatory Visit: Payer: Self-pay | Admitting: Family Medicine

## 2019-06-05 ENCOUNTER — Other Ambulatory Visit: Payer: Self-pay | Admitting: Family Medicine

## 2019-06-05 DIAGNOSIS — J4520 Mild intermittent asthma, uncomplicated: Secondary | ICD-10-CM

## 2019-06-12 ENCOUNTER — Other Ambulatory Visit: Payer: Self-pay

## 2019-06-13 ENCOUNTER — Ambulatory Visit (INDEPENDENT_AMBULATORY_CARE_PROVIDER_SITE_OTHER): Payer: Self-pay | Admitting: Family Medicine

## 2019-06-13 ENCOUNTER — Encounter: Payer: Self-pay | Admitting: Family Medicine

## 2019-06-13 VITALS — BP 110/66 | HR 49 | Ht 71.0 in | Wt 188.8 lb

## 2019-06-13 DIAGNOSIS — E785 Hyperlipidemia, unspecified: Secondary | ICD-10-CM

## 2019-06-13 DIAGNOSIS — J4520 Mild intermittent asthma, uncomplicated: Secondary | ICD-10-CM

## 2019-06-13 DIAGNOSIS — L649 Androgenic alopecia, unspecified: Secondary | ICD-10-CM

## 2019-06-13 DIAGNOSIS — Z125 Encounter for screening for malignant neoplasm of prostate: Secondary | ICD-10-CM

## 2019-06-13 MED ORDER — ATORVASTATIN CALCIUM 40 MG PO TABS: 40.0000 mg | ORAL_TABLET | Freq: Every day | ORAL | 3 refills | Status: DC

## 2019-06-13 MED ORDER — FINASTERIDE 5 MG PO TABS: ORAL_TABLET | ORAL | 3 refills | Status: DC

## 2019-06-13 MED ORDER — ALBUTEROL SULFATE HFA 108 (90 BASE) MCG/ACT IN AERS: 2.0000 | INHALATION_SPRAY | Freq: Four times a day (QID) | RESPIRATORY_TRACT | 3 refills | Status: AC | PRN

## 2019-06-13 MED ORDER — FLUTICASONE-SALMETEROL 55-14 MCG/ACT IN AEPB: 1.0000 | INHALATION_SPRAY | Freq: Two times a day (BID) | RESPIRATORY_TRACT | 3 refills | Status: DC

## 2019-06-13 NOTE — Patient Instructions (Addendum)
Please get shingrix at your pharmacy  No changes today as long as labs look good  Please stop by lab before you go If you do not have mychart- we will call you about results within 5 business days of [place] receiving them.  If you have mychart- we will send your results within 3 business days of [place] receiving them.  If abnormal or we want to clarify a result, we will call or mychart you to make sure you receive the message.  If you have questions or concerns or don't hear within 5-7 days, please send [place] a message or call us.   Recommended follow up: Return in about 1 year (around 06/12/2020) for physical or sooner if needed.

## 2019-06-13 NOTE — Progress Notes (Signed)
Phone: [phone]    Subjective:  Patient presents today for their annual physical. Chief complaint-noted.   See problem oriented charting- Review of Systems  Constitutional: Negative.   HENT: Negative.   Eyes: Negative.   Respiratory: Negative.   Cardiovascular: Negative.   Genitourinary: Negative.   Musculoskeletal: Positive for joint pain.       History of arthritis in right shoulder no change in symptoms.   Skin: Positive for rash.       Under both eyes   Neurological: Negative.   Endo/Heme/Allergies: Negative.   Psychiatric/Behavioral: Negative.    The following were reviewed and entered/updated in epic: Past Medical History:  Diagnosis Date  . Allergy   . Anxiety   . Asthma   . Bronchopneumonia    Recurrent  . Hyperlipidemia    Patient Active Problem List   Diagnosis Date Noted  . Hyperlipidemia 09/11/2012    Priority: Medium  . Bronchitis with asthma, acute 08/24/2007    Priority: Medium  . Asthma, mild intermittent, well-controlled 05/16/2007    Priority: Medium  . 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 Hamstring tendinitis of right thigh 09/12/2014    Priority: Low  . Medial epicondylitis of right elbow 06/30/2011    Priority: Low  . Seasonal and perennial allergic rhinitis 05/16/2007    Priority: Low   Past Surgical History:  Procedure Laterality Date  . torn labrum right shoulder  2008  . VASECTOMY  2007    Family History  Problem Relation Age of Onset  . Healthy Mother   . Hyperlipidemia Father   . Hyperlipidemia Sister   . Healthy Brother   . Allergic rhinitis Daughter   . Heart disease Maternal Grandmother   . Heart disease Maternal Grandfather   . Colon cancer Neg Hx   . Esophageal cancer Neg Hx   . Pancreatic cancer Neg Hx   . Rectal cancer Neg Hx   . Stomach cancer Neg Hx     Medications- reviewed and updated Current Outpatient Medications  Medication Sig Dispense Refill  . albuterol (VENTOLIN HFA) 108 (90  Base) MCG/ACT inhaler Inhale 2 puffs into the lungs every 6 (six) hours as needed for wheezing or shortness of breath (rescue inhaler). 18 g 3  . aspirin EC 81 MG tablet Take 81 mg by mouth daily.    Marland Kitchen atorvastatin (LIPITOR) 40 MG tablet Take 1 tablet (40 mg total) by mouth daily. 90 tablet 3  . finasteride (PROSCAR) 5 MG tablet TAKE 1/4 TABLET BY MOUTH DAILY 25 tablet 3  . Fluticasone-Salmeterol 55-14 MCG/ACT AEPB Inhale 1 puff into the lungs 2 (two) times daily. 3 each 3  . montelukast (SINGULAIR) 10 MG tablet TAKE ONE TABLET BY MOUTH EVERY NIGHT AT BEDTIME 90 tablet 2   No current facility-administered medications for this visit.    Allergies-reviewed and updated Allergies  Allergen Reactions  . Minocycline Rash    Erythema Multiforme    Social History   Social History Narrative   Married. 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 Worked in [place] for 7 years before that. [employer] for 5 years.    Father is retired Development worker, international aid is physician (endocrinologist)      Hobbies: tennis and raquetball, time with kids and family, church- [employer]         Objective:  BP 110/66 (BP Location: Left  Arm, Patient Position: Sitting)   Pulse (!) 49   Ht 5\' 11"  (1.803 m)   Wt 188 lb 12.8 oz (85.6 kg)   SpO2 95%   BMI 26.33 kg/m  Gen: NAD, resting comfortably HEENT: Mask not removed due to covid 19. TM normal. Bridge of nose normal. Eyelids normal.  Neck: no thyromegaly or cervical lymphadenopathy  CV: RRR no murmurs rubs or gallops Lungs: CTAB no crackles, wheeze, rhonchi Abdomen: soft/nontender/nondistended/normal bowel sounds. No rebound or guarding.  Ext: no edema Skin: warm, dry, faint erythema beside nose/under eyes but not in full malar rash pattern Neuro: grossly normal, moves all extremities, PERRLA     Assessment and Plan:  54 y.o. male presenting for annual health maintenance  counseling Health Maintenance counseling: 1. Anticipatory guidance: Patient counseled regarding regular dental exams yearly , eye exams does not get- wears readers- encouraged at least every 5 year check up at this point,  avoiding smoking and second hand smoke , limiting alcohol to 2 beverages per day. Has not drank in three years    2. Risk factor reduction:  Advised patient of need for regular exercise and diet rich and fruits and vegetables to reduce risk of heart attack and stroke. Exercise- Play Tennis, walks and lift wights. Diet-follows healthy diet. .  Wt Readings from Last 3 Encounters:  06/13/19 188 lb 12.8 oz (85.6 kg)  10/23/18 183 lb 9.6 oz (83.3 kg)  04/17/18 185 lb 9.6 oz (84.2 kg)  3. Immunizations/screenings/ancillary studies-up-to-date other than Shingrix.  He  is interested in COVID-19 vaccine when available for his face Immunization History  Administered Date(s) Administered  . Influenza Split 03/07/2011, 05/17/2012  . Influenza Whole 02/22/2008, 03/03/2009  . Influenza, Quadrivalent, Recombinant, Inj, Pf 03/16/2018  . Influenza,inj,Quad PF,6+ Mos 03/25/2013, 05/08/2014, 04/13/2015, 03/21/2019  . Influenza-Unspecified 04/07/2016, 03/31/2017  . PPD Test 06/27/2018  . Tdap 03/25/2013  4. Prostate cancer screening- trend PSA given on finasteride Lab Results  Component Value Date   PSA 0.18 04/17/2018   PSA 0.19 04/17/2017   PSA 0.34 04/06/2016   5. 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 Skin cancer screening/prevention- had not seen dermatology in over 5 years advised regular sunscreen use. Denies worrisome, changing, or new skin lesions. He does have rash under both eyes- see discussion below .  7.  STD screening- patient opts  Out as monogamous 8. never smoker-   Status of chronic or acute concerns   Hyperlipidemia, unspecified hyperlipidemia type - Plan: CBC with Differential/Platelet, Comprehensive  metabolic panel, Lipid panel -Compliant with atorvastatin.  Update lipid panel today.  Being aggressive about primary prevention with aspirin as well with elevated coronary calcium score in the past Lab Results  Component Value Date   CHOL 94 04/17/2018   HDL 36.10 (L) 04/17/2018   LDLCALC 51 04/17/2018   TRIG 37.0 04/17/2018   CHOLHDL 3 04/17/2018   - update lipids, likely continue current dose of medicine  Asthma, mild intermittent, well-controlled.  Seasonal allergies helped by see letter but also helps asthma.  Also compliant with Advair.  Uses albuterol- 2-3 x a month this year- has been slightly worse- more sports related. Continue current medicines  Male pattern baldness-uses 1.25 mg of finasteride daily. Check PSA as above.  Still helpful- continue current rx  Mild erythema beside both sides of nose. Daughter has an eczema cream (several years old in a jug) and he put it on several days and it went  away- advised him to avoid using this as we are not sure what's in it. Played tennis and it worsened. Not sure of cause- could be related to mask wearing. Is going to try hydrocortisone 1% BID for a week. Has friend who is dermatologist who he can have look at- defers derm referral for now. We discussed possible rosacea- he reports in prior months possible adult acne. I doubt malar rash as not wisely spread  Recommended follow up: Return in about 1 year (around 06/12/2020) for physical or sooner if needed.  Lab/Order associations: fasting   ICD-10-CM   1. Hyperlipidemia, unspecified hyperlipidemia type  E78.5 CBC with Differential/Platelet    Comprehensive metabolic panel    Lipid panel    CANCELED: CBC with Differential/Platelet    CANCELED: Comprehensive metabolic panel    CANCELED: Lipid panel  2. Asthma, mild intermittent, well-controlled  J45.20   3. Male pattern baldness  L64.9   4. Screening for prostate cancer  Z12.5 PSA    CANCELED: PSA    Meds ordered this encounter   Medications  . albuterol (VENTOLIN HFA) 108 (90 Base) MCG/ACT inhaler    Sig: Inhale 2 puffs into the lungs every 6 (six) hours as needed for wheezing or shortness of breath (rescue inhaler).    Dispense:  18 g    Refill:  3  . atorvastatin (LIPITOR) 40 MG tablet    Sig: Take 1 tablet (40 mg total) by mouth daily.    Dispense:  90 tablet    Refill:  3  . finasteride (PROSCAR) 5 MG tablet    Sig: TAKE 1/4 TABLET BY MOUTH DAILY    Dispense:  25 tablet    Refill:  3  . Fluticasone-Salmeterol 55-14 MCG/ACT AEPB    Sig: Inhale 1 puff into the lungs 2 (two) times daily.    Dispense:  3 each    Refill:  3    Return precautions advised.   Garret Reddish, MD

## 2019-06-14 LAB — CBC WITH DIFFERENTIAL/PLATELET
Basophils Absolute: 0.1 10*3/uL (ref 0.0–0.1)
Basophils Relative: 0.9 % (ref 0.0–3.0)
Eosinophils Absolute: 0.1 10*3/uL (ref 0.0–0.7)
Eosinophils Relative: 1.6 % (ref 0.0–5.0)
HCT: 51 % (ref 39.0–52.0)
Hemoglobin: 17.2 g/dL — ABNORMAL HIGH (ref 13.0–17.0)
Lymphocytes Relative: 36.8 % (ref 12.0–46.0)
Lymphs Abs: 3 10*3/uL (ref 0.7–4.0)
MCHC: 33.7 g/dL (ref 30.0–36.0)
MCV: 92.4 fl (ref 78.0–100.0)
Monocytes Absolute: 0.9 10*3/uL (ref 0.1–1.0)
Monocytes Relative: 11.1 % (ref 3.0–12.0)
Neutro Abs: 4 10*3/uL (ref 1.4–7.7)
Neutrophils Relative %: 49.6 % (ref 43.0–77.0)
Platelets: 178 10*3/uL (ref 150.0–400.0)
RBC: 5.52 Mil/uL (ref 4.22–5.81)
RDW: 13.3 % (ref 11.5–15.5)
WBC: 8.1 10*3/uL (ref 4.0–10.5)

## 2019-06-14 LAB — COMPREHENSIVE METABOLIC PANEL
ALT: 26 U/L (ref 0–53)
AST: 21 U/L (ref 0–37)
Albumin: 4.6 g/dL (ref 3.5–5.2)
Alkaline Phosphatase: 72 U/L (ref 39–117)
BUN: 20 mg/dL (ref 6–23)
CO2: 27 mEq/L (ref 19–32)
Calcium: 9.7 mg/dL (ref 8.4–10.5)
Chloride: 101 mEq/L (ref 96–112)
Creatinine, Ser: 1.02 mg/dL (ref 0.40–1.50)
GFR: 76.34 mL/min (ref >60.00)
Glucose, Bld: 72 mg/dL (ref 70–99)
Potassium: 4.1 mEq/L (ref 3.5–5.1)
Sodium: 138 mEq/L (ref 135–145)
Total Bilirubin: 1.9 mg/dL — ABNORMAL HIGH (ref 0.2–1.2)
Total Protein: 7.1 g/dL (ref 6.0–8.3)

## 2019-06-14 LAB — LIPID PANEL
Cholesterol: 115 mg/dL (ref 0–200)
HDL: 37.4 mg/dL — ABNORMAL LOW (ref >39.00)
LDL Cholesterol: 70 mg/dL (ref 0–99)
NonHDL: 77.9
Total CHOL/HDL Ratio: 3
Triglycerides: 38 mg/dL (ref 0.0–149.0)
VLDL: 7.6 mg/dL (ref 0.0–40.0)

## 2019-06-14 LAB — PSA: PSA: 0.2 ng/mL (ref 0.10–4.00)

## 2019-12-16 ENCOUNTER — Ambulatory Visit (INDEPENDENT_AMBULATORY_CARE_PROVIDER_SITE_OTHER): Payer: Self-pay

## 2019-12-16 ENCOUNTER — Encounter: Payer: Self-pay | Admitting: Orthopedic Surgery

## 2019-12-16 ENCOUNTER — Ambulatory Visit (INDEPENDENT_AMBULATORY_CARE_PROVIDER_SITE_OTHER): Payer: Self-pay | Admitting: Orthopedic Surgery

## 2019-12-16 DIAGNOSIS — M25561 Pain in right knee: Secondary | ICD-10-CM

## 2019-12-16 DIAGNOSIS — S83206A Unspecified tear of unspecified meniscus, current injury, right knee, initial encounter: Secondary | ICD-10-CM

## 2019-12-16 MED ORDER — LIDOCAINE HCL 1 % IJ SOLN
5.0000 mL | INTRAMUSCULAR | Status: AC | PRN
  Administered 2019-12-16: 5 mL

## 2019-12-16 MED ORDER — BUPIVACAINE HCL 0.25 % IJ SOLN
4.0000 mL | INTRAMUSCULAR | Status: AC | PRN
  Administered 2019-12-16: 4 mL via INTRA_ARTICULAR

## 2019-12-16 MED ORDER — METHYLPREDNISOLONE ACETATE 40 MG/ML IJ SUSP
40.0000 mg | INTRAMUSCULAR | Status: AC | PRN
  Administered 2019-12-16: 40 mg via INTRA_ARTICULAR

## 2019-12-16 NOTE — Progress Notes (Signed)
Office Visit Note   Patient: [name]           Date of Birth: Oct 17, 1965           MRN: [id number] Visit Date: 12/16/2019 Requested by: Marin Olp, MD [place],  [place] [id number] PCP: Marin Olp, MD  Subjective: Chief Complaint  Patient presents with  . Right Knee - Pain    HPI: [name] is an active 54 year old patient with right knee pain.  He has had it for about 2 to 3 weeks.  He has been increasing his racquetball frequency of play.  Reports inability to fully flex the right knee with heel coming about 3 to 4 inches from buttock.  On the left-hand side it touches his buttock with stretching.  He is very active in racquet sports including racquetball and tennis.  No issues with movement until recently within the last 5 to 6 days.  Denies much in the way of mechanical symptoms.  Did take an ibuprofen last night which helped his symptoms.  Denies any instability in the knee.              ROS: All systems reviewed are negative as they relate to the chief complaint within the history of present illness.  Patient denies  fevers or chills.   Assessment & Plan: Visit Diagnoses:  1. Right knee pain, unspecified chronicity     Plan: Impression is possible degenerative meniscal tear right knee with normal radiographs normal ligament exam and trace effusion.  Plan is right knee injection today.  He is going to modify his activity for the next 10 days and then resume activity as tolerated.  Should symptoms of pain or swelling recur then MRI scanning is indicated to rule out degenerative medial meniscal tear.  Follow-up as needed.  Anti-inflammatories are recommended for at least the next 5 to 6 days to assist with the cortisone injection and calming down whenever inflammatory process is going on in the knee.  Follow-Up Instructions: No follow-ups on file.   Orders:  Orders Placed This Encounter  Procedures  . XR KNEE 3 VIEW RIGHT   No orders of the  defined types were placed in this encounter.     Procedures: Large Joint Inj: R knee on 12/16/2019 10:01 PM Indications: diagnostic evaluation, joint swelling and pain Details: 18 G 1.5 in needle, superolateral approach  Arthrogram: No  Medications: 5 mL lidocaine 1 %; 40 mg methylPREDNISolone acetate 40 MG/ML; 4 mL bupivacaine 0.25 % Outcome: tolerated well, no immediate complications Procedure, treatment alternatives, risks and benefits explained, specific risks discussed. Consent was given by the patient. Immediately prior to procedure a time out was called to verify the correct patient, procedure, equipment, support staff and site/side marked as required. Patient was prepped and draped in the usual sterile fashion.       Clinical Data: No additional findings.  Objective: Vital Signs: There were no vitals taken for this visit.  Physical Exam:   Constitutional: Patient appears well-developed HEENT:  Head: Normocephalic Eyes:EOM are normal Neck: Normal range of motion Cardiovascular: Normal rate Pulmonary/chest: Effort normal Neurologic: Patient is alert Skin: Skin is warm Psychiatric: Patient has normal mood and affect    Ortho Exam: Ortho exam demonstrates full active and passive range of motion of the right knee.  Trace effusion present.  He has posterior medial joint line tenderness on the right negative on the left.  No masses lymphadenopathy or skin changes noted  in the right knee region.  Collateral and cruciate ligaments are stable.  Pedal pulses palpable.  No groin pain with internal or external rotation of the leg.  Specialty Comments:  No specialty comments available.  Imaging: No results found.   PMFS History: Patient Active Problem List   Diagnosis Date Noted  . 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 Hamstring tendinitis of right thigh 09/12/2014  . Hyperlipidemia 09/11/2012  . Medial epicondylitis of right elbow 06/30/2011  . Bronchitis with asthma,  acute 08/24/2007  . Seasonal and perennial allergic rhinitis 05/16/2007  . Asthma, mild intermittent, well-controlled 05/16/2007   Past Medical History:  Diagnosis Date  . Allergy   . Anxiety   . Asthma   . Bronchopneumonia    Recurrent  . Hyperlipidemia     Family History  Problem Relation Age of Onset  . Healthy Mother   . Hyperlipidemia Father   . Hyperlipidemia Sister   . Healthy Brother   . Allergic rhinitis Daughter   . Heart disease Maternal Grandmother   . Heart disease Maternal Grandfather   . Colon cancer Neg Hx   . Esophageal cancer Neg Hx   . Pancreatic cancer Neg Hx   . Rectal cancer Neg Hx   . Stomach cancer Neg Hx     Past Surgical History:  Procedure Laterality Date  . torn labrum right shoulder  2008  . VASECTOMY  2007   Social History   Occupational History  . 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 Smoking status: Never Smoker  . Smokeless tobacco: Never Used  Vaping Use  . Vaping Use: Never used  Substance and Sexual Activity  . Alcohol use: Yes    Alcohol/week: 1.0 standard drink    Types: 1 Standard drinks or equivalent per week    Comment: Rarely  . Drug use: No  . Sexual activity: Not on file

## 2020-02-27 ENCOUNTER — Other Ambulatory Visit: Payer: Self-pay | Admitting: Family Medicine

## 2020-03-18 ENCOUNTER — Telehealth (HOSPITAL_COMMUNITY): Payer: Self-pay | Admitting: *Deleted

## 2020-03-18 DIAGNOSIS — I2510 Atherosclerotic heart disease of native coronary artery without angina pectoris: Secondary | ICD-10-CM

## 2020-03-18 DIAGNOSIS — E785 Hyperlipidemia, unspecified: Secondary | ICD-10-CM

## 2020-03-18 NOTE — Telephone Encounter (Signed)
Per Dr Aundra Dubin pt needs calcium score CT due to h/o CAD, order placed will schedule

## 2020-03-24 ENCOUNTER — Other Ambulatory Visit: Payer: Self-pay

## 2020-03-24 ENCOUNTER — Ambulatory Visit
Admission: RE | Admit: 2020-03-24 | Discharge: 2020-03-24 | Disposition: A | Discharge status: Home / Self Care | Payer: Self-pay | Source: Ambulatory Visit | Attending: Cardiology | Admitting: Cardiology

## 2020-03-24 ENCOUNTER — Telehealth (HOSPITAL_COMMUNITY): Payer: Self-pay

## 2020-03-24 DIAGNOSIS — E785 Hyperlipidemia, unspecified: Secondary | ICD-10-CM

## 2020-03-24 DIAGNOSIS — I2510 Atherosclerotic heart disease of native coronary artery without angina pectoris: Secondary | ICD-10-CM

## 2020-03-24 MED ORDER — ATORVASTATIN CALCIUM 80 MG PO TABS
80.0000 mg | ORAL_TABLET | Freq: Every day | ORAL | 3 refills | Status: DC
Start: 2020-03-24 — End: 2021-03-31

## 2020-03-24 NOTE — Telephone Encounter (Signed)
Patient advised and verbalized understanding. New Rx sent into patients pharmacy. Patient will have labs drawn at another office and will send results to [place]

## 2020-03-24 NOTE — Telephone Encounter (Signed)
-----   Message from Larey Dresser, MD sent at 03/24/2020  4:45 PM EDT ----- Increase atorvastatin to 80 mg daily.  Please call in to his pharmacy and let him know when there. Lipids/LFTs in 2 months.

## 2020-03-26 ENCOUNTER — Other Ambulatory Visit: Payer: Self-pay

## 2020-04-24 ENCOUNTER — Other Ambulatory Visit: Payer: Self-pay | Admitting: Family Medicine

## 2020-04-24 DIAGNOSIS — J4520 Mild intermittent asthma, uncomplicated: Secondary | ICD-10-CM

## 2020-05-09 ENCOUNTER — Other Ambulatory Visit: Payer: Self-pay | Admitting: Family Medicine

## 2020-06-16 ENCOUNTER — Other Ambulatory Visit: Payer: Self-pay

## 2020-06-16 NOTE — Progress Notes (Signed)
Phone: [phone]   Subjective:  Patient presents today for their annual physical. Chief complaint-noted.   See problem oriented charting- ROS- full  review of systems was completed and negative  except for: joint pain- right knee at times, right shoulder still- will eventually need shoulder replacement  The following were reviewed and entered/updated in epic: Past Medical History:  Diagnosis Date  . Allergy   . Anxiety   . Asthma   . Bronchopneumonia    Recurrent  . Hyperlipidemia    Patient Active Problem List   Diagnosis Date Noted  . Hyperlipidemia 09/11/2012    Priority: Medium  . Bronchitis with asthma, acute 08/24/2007    Priority: Medium  . Asthma, mild intermittent, well-controlled 05/16/2007    Priority: Medium  . 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 Hamstring tendinitis of right thigh 09/12/2014    Priority: Low  . Medial epicondylitis of right elbow 06/30/2011    Priority: Low  . Seasonal and perennial allergic rhinitis 05/16/2007    Priority: Low   Past Surgical History:  Procedure Laterality Date  . torn labrum right shoulder  2008  . VASECTOMY  2007    Family History  Problem Relation Age of Onset  . Healthy Mother   . Hyperlipidemia Father   . Hyperlipidemia Sister   . Healthy Brother   . Allergic rhinitis Daughter   . Heart disease Maternal Grandmother   . Heart disease Maternal Grandfather   . Colon cancer Neg Hx   . Esophageal cancer Neg Hx   . Pancreatic cancer Neg Hx   . Rectal cancer Neg Hx   . Stomach cancer Neg Hx     Medications- reviewed and updated Current Outpatient Medications  Medication Sig Dispense Refill  . albuterol (VENTOLIN HFA) 108 (90 Base) MCG/ACT inhaler Inhale 2 puffs into the lungs every 6 (six) hours as needed for wheezing or shortness of breath (rescue inhaler). 18 g 3  . aspirin EC 81 MG tablet Take 81 mg by mouth daily.    Marland Kitchen atorvastatin (LIPITOR) 80 MG tablet Take 1 tablet (80 mg total)  by mouth daily. 90 tablet 3  . Fexofenadine HCl (ALLEGRA ALLERGY PO) Take by mouth as needed.    . finasteride (PROSCAR) 5 MG tablet TAKE ONE-FOURTH TABLET BY MOUTH DAILY 25 tablet 3  . Fluticasone-Salmeterol 55-14 MCG/ACT AEPB INHALKE 1 PUFF INTO LUNGS TWICE DAILY 3 each 3  . montelukast (SINGULAIR) 10 MG tablet TAKE ONE TABLET BY MOUTH EVERY NIGHT AT BEDTIME 90 tablet 2   No current facility-administered medications for this visit.    Allergies-reviewed and updated Allergies  Allergen Reactions  . Minocycline Rash    Erythema Multiforme    Social History   Social History Narrative   Married. 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 Worked in [place] for 7 years before that. [employer] for 5 years.    Father is retired Development worker, international aid is physician (endocrinologist)      Hobbies: tennis and raquetball, time with kids and family, church- [employer]      Objective  Objective:  BP 100/80   Pulse (!) 52   Temp (!) 97.3 F (36.3 C) (Temporal)   Ht 5\' 11"  (1.803 m)   Wt 181 lb 6.4 oz (82.3 kg)   SpO2 98%   BMI 25.30 kg/m  Gen: NAD, resting comfortably HEENT:  Mucous membranes are moist. Oropharynx normal Neck: no thyromegaly CV: bradycardic no murmurs rubs or gallops Lungs: CTAB no crackles, wheeze, rhonchi Abdomen: soft/nontender/nondistended/normal bowel sounds. No rebound or guarding.  Ext: no edema Skin: warm, dry Neuro: grossly normal, moves all extremities, PERRLA   Assessment and Plan  55 y.o. male presenting for annual physical.  Health Maintenance counseling: 1. Anticipatory guidance: Patient counseled regarding regular dental exams -q6-12 months, eye exams -advised at least every 5 years (not lately)-he wears readers-at his age regular would be reasonable as well,  avoiding smoking and second hand smoke , limiting alcohol to 2 beverages per day-he does not drink  alcohol-stopped 4 years ago.   2. Risk factor reduction:  Advised patient of need for regular exercise and diet rich and fruits and vegetables to reduce risk of heart attack and stroke. Exercise-plays tennis 3 days a week down to 2 to protect shoulder/knee, walks, lifts weights. Diet-reasonably healthy diet- but improved further recently Wt Readings from Last 3 Encounters:  06/17/20 181 lb 6.4 oz (82.3 kg)  06/13/19 188 lb 12.8 oz (85.6 kg)  10/23/18 183 lb 9.6 oz (83.3 kg)  3. Immunizations/screenings/ancillary studies-up-to-date other than shingrix-I think this is reasonable to do as he has now had 2 cases of shingles - opts out for now. Discussed HCV screen- opts out for now .  Flu shot- already had.  Also had COVID November 2020 Immunization History  Administered Date(s) Administered  . Influenza Split 03/07/2011, 05/17/2012  . Influenza Whole 02/22/2008, 03/03/2009  . Influenza, Quadrivalent, Recombinant, Inj, Pf 03/16/2018  . Influenza,inj,Quad PF,6+ Mos 03/25/2013, 05/08/2014, 04/13/2015, 03/21/2019  . Influenza-Unspecified 04/07/2016, 03/31/2017, 03/25/2020  . PFIZER SARS-COV-2 Vaccination 08/10/2019, 08/31/2019, 04/04/2020  . PPD Test 06/27/2018  . Tdap 03/25/2013  4. Prostate cancer screening- continue to trend PSA given he is on finasteride Lab Results  Component Value Date   PSA 0.20 06/13/2019   PSA 0.18 04/17/2018   PSA 0.19 04/17/2017   5. Colon cancer screening - tubular adenoma July 11, 2017-he will be due next month-offered referral today- he prefers to call on his own  6. Skin cancer screening-has seen dermatology 3-4 months ago- good full check. advised regular sunscreen use. Denies worrisome, changing, or new skin lesions.  7.  Never smoker 8. STD screening - opts out as monogamous  Status of chronic or acute concerns   #hyperlipidemia S: Medication: atorvastatin 80Mg  (increased to this after coronary calcium of 3000 in 03/24/20 which is 94% for age), aspirin 81  mg Lab Results  Component Value Date   CHOL 115 06/13/2019   HDL 37.40 (L) 06/13/2019   LDLCALC 70 06/13/2019   TRIG 38.0 06/13/2019   CHOLHDL 3 06/13/2019   A/P: Excellent control last year but even more aggressive now to get LDL closer to 50- update levels today.  We are being aggressive about primary prevention given elevated coronary calcium score in the past-see above   # Asthma/allergies S: Maintenance Medication: allegra (more lately in winter), advair generic 1 puff twice daily, Singulair 10Mg -helps allergies as well as asthma As needed medication: Albuterol. Patient is using this a handful of times a year- mainly with illness  A/P: good control- continue current medicines   #Redness beside nose bilaterally last year- improved with eczema cream from daughter but we are not sure of strength so recommend he avoid this.  Worsened with tenderness.  Thought could be related to mask wearing.  We recommended trial of hydrocortisone 1% twice daily.  He has  a friend who is a Paediatric nurse he plan to have look at it but declined formal referral  Today reports- very mild comes and goes- neosporin helps. Not a major issue  #Male pattern baldness-uses 1.25 mg of finasteride daily.  We will trend PSA.  Recommended follow up: Return in about 1 year (around 06/17/2021) for physical or sooner if needed (we will bill as regular office visit still if self pay). Future Appointments  Date Time Provider [place]  06/18/2021  8:00 AM Marin Olp, MD [facility]   Lab/Order associations: fasting   ICD-10-CM   1. Hyperlipidemia, unspecified hyperlipidemia type  E78.5 CBC with Differential/Platelet    Comprehensive metabolic panel    Lipid panel  2. Asthma, mild intermittent, well-controlled  J45.20   3. Screening for prostate cancer  Z12.5 PSA  4. Seasonal and perennial allergic rhinitis  J30.89    J30.2     No orders of the defined types were placed in this encounter.   Return  precautions advised.  Garret Reddish, MD

## 2020-06-16 NOTE — Patient Instructions (Addendum)
Please stop by lab before you go If you have mychart- we will send your results within 3 business days of [place] receiving them.  If you do not have mychart- we will call you about results within 5 business days of [place] receiving them.  *please also note that you will see labs on mychart as soon as they post. I will later go in and write notes on them- will say "notes from Dr. Yong Channel"   Recommended follow up: Return in about 1 year (around 06/17/2021) for physical or sooner if needed (we will bill as regular office visit still if self pay).

## 2020-06-17 ENCOUNTER — Encounter: Payer: Self-pay | Admitting: Family Medicine

## 2020-06-17 ENCOUNTER — Ambulatory Visit (INDEPENDENT_AMBULATORY_CARE_PROVIDER_SITE_OTHER): Payer: Self-pay | Admitting: Family Medicine

## 2020-06-17 VITALS — BP 100/80 | HR 52 | Temp 97.3°F | Ht 71.0 in | Wt 181.4 lb

## 2020-06-17 DIAGNOSIS — E785 Hyperlipidemia, unspecified: Secondary | ICD-10-CM

## 2020-06-17 DIAGNOSIS — J4520 Mild intermittent asthma, uncomplicated: Secondary | ICD-10-CM

## 2020-06-17 DIAGNOSIS — Z0000 Encounter for general adult medical examination without abnormal findings: Secondary | ICD-10-CM

## 2020-06-17 DIAGNOSIS — Z125 Encounter for screening for malignant neoplasm of prostate: Secondary | ICD-10-CM

## 2020-06-17 DIAGNOSIS — J302 Other seasonal allergic rhinitis: Secondary | ICD-10-CM

## 2020-06-17 DIAGNOSIS — J3089 Other allergic rhinitis: Secondary | ICD-10-CM

## 2020-06-17 LAB — CBC WITH DIFFERENTIAL/PLATELET
Basophils Absolute: 0 10*3/uL (ref 0.0–0.1)
Basophils Relative: 0.4 % (ref 0.0–3.0)
Eosinophils Absolute: 0.1 10*3/uL (ref 0.0–0.7)
Eosinophils Relative: 1.8 % (ref 0.0–5.0)
HCT: 52.7 % — ABNORMAL HIGH (ref 39.0–52.0)
Hemoglobin: 17.9 g/dL — ABNORMAL HIGH (ref 13.0–17.0)
Lymphocytes Relative: 30.7 % (ref 12.0–46.0)
Lymphs Abs: 2 10*3/uL (ref 0.7–4.0)
MCHC: 34 g/dL (ref 30.0–36.0)
MCV: 91.3 fl (ref 78.0–100.0)
Monocytes Absolute: 0.7 10*3/uL (ref 0.1–1.0)
Monocytes Relative: 10 % (ref 3.0–12.0)
Neutro Abs: 3.7 10*3/uL (ref 1.4–7.7)
Neutrophils Relative %: 57.1 % (ref 43.0–77.0)
Platelets: 154 10*3/uL (ref 150.0–400.0)
RBC: 5.77 Mil/uL (ref 4.22–5.81)
RDW: 13.1 % (ref 11.5–15.5)
WBC: 6.5 10*3/uL (ref 4.0–10.5)

## 2020-06-17 LAB — COMPREHENSIVE METABOLIC PANEL
ALT: 27 U/L (ref 0–53)
AST: 20 U/L (ref 0–37)
Albumin: 4.6 g/dL (ref 3.5–5.2)
Alkaline Phosphatase: 69 U/L (ref 39–117)
BUN: 23 mg/dL (ref 6–23)
CO2: 31 mEq/L (ref 19–32)
Calcium: 9.6 mg/dL (ref 8.4–10.5)
Chloride: 102 mEq/L (ref 96–112)
Creatinine, Ser: 1.05 mg/dL (ref 0.40–1.50)
GFR: 80.64 mL/min (ref >60.00)
Glucose, Bld: 88 mg/dL (ref 70–99)
Potassium: 4.3 mEq/L (ref 3.5–5.1)
Sodium: 138 mEq/L (ref 135–145)
Total Bilirubin: 1.9 mg/dL — ABNORMAL HIGH (ref 0.2–1.2)
Total Protein: 7.1 g/dL (ref 6.0–8.3)

## 2020-06-17 LAB — PSA: PSA: 0.21 ng/mL (ref 0.10–4.00)

## 2020-06-17 LAB — LIPID PANEL
Cholesterol: 94 mg/dL (ref 0–200)
HDL: 33.7 mg/dL — ABNORMAL LOW (ref >39.00)
LDL Cholesterol: 49 mg/dL (ref 0–99)
NonHDL: 60.26
Total CHOL/HDL Ratio: 3
Triglycerides: 56 mg/dL (ref 0.0–149.0)
VLDL: 11.2 mg/dL (ref 0.0–40.0)

## 2020-09-11 ENCOUNTER — Encounter: Payer: Self-pay | Admitting: Physician Assistant

## 2020-09-11 ENCOUNTER — Ambulatory Visit (INDEPENDENT_AMBULATORY_CARE_PROVIDER_SITE_OTHER): Payer: Self-pay | Admitting: Physician Assistant

## 2020-09-11 ENCOUNTER — Other Ambulatory Visit: Payer: Self-pay

## 2020-09-11 VITALS — BP 90/60 | HR 53 | Temp 97.7°F | Ht 71.0 in | Wt 180.2 lb

## 2020-09-11 DIAGNOSIS — H8110 Benign paroxysmal vertigo, unspecified ear: Secondary | ICD-10-CM

## 2020-09-11 NOTE — Progress Notes (Signed)
[name] is a 55 y.o. male here for a new problem.  I acted as a Education administrator for [employer], PA-C Anselmo Pickler, LPN   History of Present Illness:   Chief Complaint  Patient presents with  . Dizziness    HPI   Dizziness Pt has been having episodes of "vertigo" since a few months, has had episodes of "feeling lightheaded and feels like he is spinning." Symptoms are typically preceded by heavy exercise the day prior.   Had a more pronounced episode on 2022/09/03, caused him to have to be in the bed for half a day the next day. Had nausea. Symptoms have lingered since that time. Yesterday he went to put eye drops in and put his head back and had worsening symptoms.  Denies ringing in ears, hearing loss, HA, vomiting, chest pain, SOB, slurred speech, weakness on one side of the body, significant weight changes.  BP Readings from Last 3 Encounters:  09/11/20 90/60  06/17/20 100/80  06/13/19 110/66    Past Medical History:  Diagnosis Date  . Allergy   . Anxiety   . Asthma   . Bronchopneumonia    Recurrent  . Hyperlipidemia      Social History   Tobacco Use  . Smoking status: Never Smoker  . Smokeless tobacco: Never Used  Vaping Use  . Vaping Use: Never used  Substance Use Topics  . Alcohol use: Yes    Alcohol/week: 1.0 standard drink    Types: 1 Standard drinks or equivalent per week    Comment: Rarely  . Drug use: No    Past Surgical History:  Procedure Laterality Date  . torn labrum right shoulder  2008  . VASECTOMY  2007    Family History  Problem Relation Age of Onset  . Healthy Mother   . Hyperlipidemia Father   . Hyperlipidemia Sister   . Healthy Brother   . Allergic rhinitis Daughter   . Heart disease Maternal Grandmother   . Heart disease Maternal Grandfather   . Colon cancer Neg Hx   . Esophageal cancer Neg Hx   . Pancreatic cancer Neg Hx   . Rectal cancer Neg Hx   . Stomach cancer Neg Hx     Allergies  Allergen Reactions  .  Minocycline Rash    Erythema Multiforme    Current Medications:   Current Outpatient Medications:  .  albuterol (VENTOLIN HFA) 108 (90 Base) MCG/ACT inhaler, Inhale 2 puffs into the lungs every 6 (six) hours as needed for wheezing or shortness of breath (rescue inhaler)., Disp: 18 g, Rfl: 3 .  aspirin EC 81 MG tablet, Take 81 mg by mouth daily., Disp: , Rfl:  .  atorvastatin (LIPITOR) 80 MG tablet, Take 1 tablet (80 mg total) by mouth daily., Disp: 90 tablet, Rfl: 3 .  Fexofenadine HCl (ALLEGRA ALLERGY PO), Take by mouth as needed., Disp: , Rfl:  .  finasteride (PROSCAR) 5 MG tablet, TAKE ONE-FOURTH TABLET BY MOUTH DAILY, Disp: 25 tablet, Rfl: 3 .  Fluticasone-Salmeterol 55-14 MCG/ACT AEPB, INHALKE 1 PUFF INTO LUNGS TWICE DAILY, Disp: 3 each, Rfl: 3 .  melatonin 3 MG TABS tablet, Take 3 mg by mouth at bedtime as needed., Disp: , Rfl:  .  montelukast (SINGULAIR) 10 MG tablet, TAKE ONE TABLET BY MOUTH EVERY NIGHT AT BEDTIME, Disp: 90 tablet, Rfl: 2   Review of Systems:   ROS Negative unless otherwise specified per HPI.  Vitals:   Vitals:   09/11/20 1142  BP: 90/60  Pulse: (!) 53  Temp: 97.7 F (36.5 C)  TempSrc: Temporal  SpO2: 94%  Weight: 180 lb 4 oz (81.8 kg)  Height: 5\' 11"  (1.803 m)     Body mass index is 25.14 kg/m.  Orthostatics completed -- in chart  Physical Exam:   Physical Exam Vitals and nursing note reviewed.  Constitutional:      General: He is not in acute distress.    Appearance: He is well-developed. He is not ill-appearing or toxic-appearing.  Cardiovascular:     Rate and Rhythm: Regular rhythm. Bradycardia present.     Pulses: Normal pulses.     Heart sounds: Normal heart sounds, S1 normal and S2 normal.     Comments: No LE edema Pulmonary:     Effort: Pulmonary effort is normal.     Breath sounds: Normal breath sounds.  Skin:    General: Skin is warm and dry.  Neurological:     General: No focal deficit present.     Mental Status: He is  alert.     GCS: GCS eye subscore is 4. GCS verbal subscore is 5. GCS motor subscore is 6.     Cranial Nerves: Cranial nerves are intact.     Sensory: Sensation is intact.     Motor: Motor function is intact.     Coordination: Coordination is intact.     Comments: Dix Hallpike positive on L side R side not attempted  Psychiatric:        Speech: Speech normal.        Behavior: Behavior normal. Behavior is cooperative.     Assessment and Plan:   Tyvion was seen today for dizziness.  Diagnoses and all orders for this visit:  Benign paroxysmal positional vertigo, unspecified laterality -     Ambulatory referral to Physical Therapy   No red flags on neuro exam demonstrated need for acute imaging. Referral for PT placed today, we were able to secure appointment at [facility] today at 3:15pm for vestibular rehab. BP also significantly lower for him than usual. Continue to push fluids, states that he historically has issues with keeping well hydrated. If symptoms persist after PT, recommend follow-up with PCP. Continue to monitor BP, if remains low, recommend follow-up with PCP.  CMA or LPN served as scribe during this visit. History, Physical, and Plan performed by medical provider. The above documentation has been reviewed and is accurate and complete.   Inda Coke, PA-C

## 2021-01-06 ENCOUNTER — Other Ambulatory Visit: Payer: Self-pay

## 2021-01-06 ENCOUNTER — Ambulatory Visit (AMBULATORY_SURGERY_CENTER): Payer: Self-pay | Admitting: *Deleted

## 2021-01-06 VITALS — Ht 71.0 in | Wt 175.0 lb

## 2021-01-06 DIAGNOSIS — Z86010 Personal history of colonic polyps: Secondary | ICD-10-CM

## 2021-01-06 MED ORDER — SUTAB 1479-225-188 MG PO TABS: 24.0000 | ORAL_TABLET | ORAL | 0 refills | Status: DC

## 2021-01-06 NOTE — Progress Notes (Signed)
No egg or soy allergy known to patient  No issues with past sedation with any surgeries or procedures Patient denies ever being told they had issues or difficulty with intubation  No FH of Malignant Hyperthermia No diet pills per patient No home 02 use per patient  No blood thinners per patient  Pt denies issues with constipation  No A fib or A flutter  EMMI video to pt or via [place] 19 guidelines implemented in PV today with Pt and RN  Pt is fully vaccinated  for Covid   LOW PULSE IS NORMAL FOR PT   SUTAB  Coupon given to pt in PV today , Code to Pharmacy and  NO PA's for preps discussed with pt In PV today  Discussed with pt there will be an out-of-pocket cost for prep and that varies from $0 to 70 dollars   Due to the COVID-19 pandemic we are asking patients to follow certain guidelines.  Pt aware of COVID protocols and [facility] guidelines

## 2021-01-20 ENCOUNTER — Encounter: Payer: Self-pay | Admitting: Internal Medicine

## 2021-01-20 ENCOUNTER — Ambulatory Visit (AMBULATORY_SURGERY_CENTER): Payer: Self-pay | Admitting: Internal Medicine

## 2021-01-20 ENCOUNTER — Other Ambulatory Visit: Payer: Self-pay

## 2021-01-20 VITALS — BP 99/64 | HR 44 | Temp 96.0°F | Resp 11 | Ht 71.0 in | Wt 175.0 lb

## 2021-01-20 DIAGNOSIS — Z86010 Personal history of colonic polyps: Secondary | ICD-10-CM

## 2021-01-20 MED ORDER — SODIUM CHLORIDE 0.9 % IV SOLN
500.0000 mL | Freq: Once | INTRAVENOUS | Status: DC
Start: 2021-01-20 — End: 2021-01-20

## 2021-01-20 NOTE — Progress Notes (Signed)
A and O x3. Report to RN. Tolerated MAC anesthesia well. 

## 2021-01-20 NOTE — Patient Instructions (Signed)
Handout given for diverticulosis.  You need to have another colonoscopy in 5 years.  YOU HAD AN ENDOSCOPIC PROCEDURE TODAY AT [facility]:   Refer to the procedure report that was given to you for any specific questions about what was found during the examination.  If the procedure report does not answer your questions, please call your gastroenterologist to clarify.  If you requested that your care partner not be given the details of your procedure findings, then the procedure report has been included in a sealed envelope for you to review at your convenience later.  YOU SHOULD EXPECT: Some feelings of bloating in the abdomen. Passage of more gas than usual.  Walking can help get rid of the air that was put into your GI tract during the procedure and reduce the bloating. If you had a lower endoscopy (such as a colonoscopy or flexible sigmoidoscopy) you may notice spotting of blood in your stool or on the toilet paper. If you underwent a bowel prep for your procedure, you may not have a normal bowel movement for a few days.  Please Note:  You might notice some irritation and congestion in your nose or some drainage.  This is from the oxygen used during your procedure.  There is no need for concern and it should clear up in a day or so.  SYMPTOMS TO REPORT IMMEDIATELY:  Following lower endoscopy (colonoscopy or flexible sigmoidoscopy):  Excessive amounts of blood in the stool  Significant tenderness or worsening of abdominal pains  Swelling of the abdomen that is new, acute  Fever of 100F or higher  For urgent or emergent issues, a gastroenterologist can be reached at any hour by calling [phone]. Do not use MyChart messaging for urgent concerns.    DIET:  We do recommend a small meal at first, but then you may proceed to your regular diet.  Drink plenty of fluids but you should avoid alcoholic beverages for 24 hours.  ACTIVITY:  You should plan to take it easy for the  rest of today and you should NOT DRIVE or use heavy machinery until tomorrow (because of the sedation medicines used during the test).    FOLLOW UP: Our staff will call the number listed on your records 48-72 hours following your procedure to check on you and address any questions or concerns that you may have regarding the information given to you following your procedure. If we do not reach you, we will leave a message.  We will attempt to reach you two times.  During this call, we will ask if you have developed any symptoms of COVID 19. If you develop any symptoms (ie: fever, flu-like symptoms, shortness of breath, cough etc.) before then, please call [phone].  If you test positive for Covid 19 in the 2 weeks post procedure, please call and report this information to [place].    If any biopsies were taken you will be contacted by phone or by letter within the next 1-3 weeks.  Please call us at [phone] if you have not heard about the biopsies in 3 weeks.    SIGNATURES/CONFIDENTIALITY: You and/or your care partner have signed paperwork which will be entered into your electronic medical record.  These signatures attest to the fact that that the information above on your After Visit Summary has been reviewed and is understood.  Full responsibility of the confidentiality of this discharge information lies with you and/or your care-partner.

## 2021-01-20 NOTE — Progress Notes (Signed)
GASTROENTEROLOGY PROCEDURE H&P NOTE   Primary Care Physician: Marin Olp, MD    Reason for Procedure:   History of colon polyps   Plan:    colonoscopy  Patient is appropriate for endoscopic procedure(s) in the ambulatory ([facility]) setting.  The nature of the procedure, as well as the risks, benefits, and alternatives were carefully and thoroughly reviewed with the patient. Ample time for discussion and questions allowed. The patient understood, was satisfied, and agreed to proceed.     HPI: [name] is a 55 y.o. male who presents for surveillance colonoscopy.  Medical hx as below.  No complaints today.  No chest pain, dyspnea.  Past Medical History:  Diagnosis Date   Allergy    Anxiety    Asthma    BPPV (benign paroxysmal positional vertigo)    X2 2022   Bronchopneumonia    Recurrent   COVID-19    X2   Hyperlipidemia    Shingles     Past Surgical History:  Procedure Laterality Date   COLONOSCOPY     POLYPECTOMY     torn labrum right shoulder  06/06/2006   VASECTOMY  06/06/2005    Prior to Admission medications   Medication Sig Start Date End Date Taking? Authorizing Provider  aspirin EC 81 MG tablet Take 81 mg by mouth daily.   Yes [provider]  atorvastatin (LIPITOR) 80 MG tablet Take 1 tablet (80 mg total) by mouth daily. 03/24/20  Yes Larey Dresser, MD  finasteride (PROSCAR) 5 MG tablet TAKE ONE-FOURTH TABLET BY MOUTH DAILY 02/27/20  Yes Marin Olp, MD  Fluticasone-Salmeterol 55-14 MCG/ACT AEPB INHALKE 1 PUFF INTO LUNGS TWICE DAILY 05/10/20  Yes Marin Olp, MD  melatonin 3 MG TABS tablet Take 3 mg by mouth at bedtime as needed.   Yes [provider]  montelukast (SINGULAIR) 10 MG tablet TAKE ONE TABLET BY MOUTH EVERY NIGHT AT BEDTIME 04/24/20  Yes Marin Olp, MD  albuterol (VENTOLIN HFA) 108 (90 Base) MCG/ACT inhaler Inhale 2 puffs into the lungs every 6 (six) hours as needed for wheezing or shortness of  breath (rescue inhaler). 06/13/19   Marin Olp, MD  Fexofenadine HCl [facility] ALLERGY PO) Take by mouth as needed.    [provider]    Current Outpatient Medications  Medication Sig Dispense Refill   aspirin EC 81 MG tablet Take 81 mg by mouth daily.     atorvastatin (LIPITOR) 80 MG tablet Take 1 tablet (80 mg total) by mouth daily. 90 tablet 3   finasteride (PROSCAR) 5 MG tablet TAKE ONE-FOURTH TABLET BY MOUTH DAILY 25 tablet 3   Fluticasone-Salmeterol 55-14 MCG/ACT AEPB INHALKE 1 PUFF INTO LUNGS TWICE DAILY 3 each 3   melatonin 3 MG TABS tablet Take 3 mg by mouth at bedtime as needed.     montelukast (SINGULAIR) 10 MG tablet TAKE ONE TABLET BY MOUTH EVERY NIGHT AT BEDTIME 90 tablet 2   albuterol (VENTOLIN HFA) 108 (90 Base) MCG/ACT inhaler Inhale 2 puffs into the lungs every 6 (six) hours as needed for wheezing or shortness of breath (rescue inhaler). 18 g 3   Fexofenadine HCl (ALLEGRA ALLERGY PO) Take by mouth as needed.     Current Facility-Administered Medications  Medication Dose Route Frequency Provider Last Rate Last Admin   0.9 %  sodium chloride infusion  500 mL Intravenous Once Hayla Hinger, Lajuan Lines, MD        Allergies as of 01/20/2021 - Review  Complete 01/20/2021  Allergen Reaction Noted   Minocycline Rash 08/28/2011    Family History  Problem Relation Age of Onset   Healthy Mother    Hyperlipidemia Father    Hyperlipidemia Sister    Healthy Brother    Heart disease Maternal Grandmother    Heart disease Maternal Grandfather    Allergic rhinitis Daughter    Colon cancer Neg Hx    Esophageal cancer Neg Hx    Pancreatic cancer Neg Hx    Rectal cancer Neg Hx    Stomach cancer Neg Hx    Colon polyps Neg Hx     Social History   Socioeconomic History   Marital status: Married    Spouse name: Not on file   Number of children: Not on file   Years of education: Not on file   Highest education level: Not on file  Occupational History   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 Worked in [place] for 7 years before that. [employer] for 5 years.    Father is retired Development worker, international aid is physician (endocrinologist)      Hobbies: tennis and raquetball, time with kids and family, church- Probation officer      Social Determinants of Radio broadcast assistant Strain: Not on [employer] Insecurity: Not on file  Transportation Needs: Not on file  Physical Activity: Not on file  Stress: Not on file  Social Connections: Not on file  Intimate Partner Violence: Not on file    Physical Exam: Vital signs in last 24 hours: '@BP'$  108/72   Pulse (!) 45   Temp (!) 96 F (35.6 C)   Ht '5\' 11"'$  (1.803 m)   Wt 175 lb (79.4 kg)   SpO2 98%   BMI 24.41 kg/m  GEN: NAD EYE: Sclerae anicteric ENT: MMM CV: Non-tachycardic Pulm: CTA b/l GI: Soft, NT/ND NEURO:  Alert & Oriented x 3   Zenovia Jarred, MD [facility]  01/20/2021 2:32 PM

## 2021-01-20 NOTE — Progress Notes (Addendum)
Pt's states no medical or surgical changes since previsit or office visit.   VS taken by AS

## 2021-01-20 NOTE — Op Note (Signed)
[facility] Patient Name: [name] Procedure Date: 01/20/2021 2:35 PM MRN: [id number] Endoscopist: Jerene Bears , MD Age: 55 Referring MD:  Date of Birth: [birth year] Gender: Male Account #: [id number] Procedure:                Colonoscopy Indications:              High risk colon cancer surveillance: Personal                            history of multiple adenomas, Last colonoscopy:                            February 2019 (7 adenomas removed) Medicines:                Monitored Anesthesia Care Procedure:                Pre-Anesthesia Assessment:                           - Prior to the procedure, a History and Physical                            was performed, and patient medications and                            allergies were reviewed. The patient's tolerance of                            previous anesthesia was also reviewed. The risks                            and benefits of the procedure and the sedation                            options and risks were discussed with the patient.                            All questions were answered, and informed consent                            was obtained. Prior Anticoagulants: The patient has                            taken no previous anticoagulant or antiplatelet                            agents. ASA Grade Assessment: II - A patient with                            mild systemic disease. After reviewing the risks                            and benefits, the patient was deemed in  satisfactory condition to undergo the procedure.                           After obtaining informed consent, the colonoscope                            was passed under direct vision. Throughout the                            procedure, the patient's blood pressure, pulse, and                            oxygen saturations were monitored continuously. The                            CF [id number] was introduced  through the anus                            and advanced to the cecum, identified by                            transillumination. The colonoscopy was performed                            without difficulty. The patient tolerated the                            procedure well. The quality of the bowel                            preparation was excellent. The ileocecal valve,                            appendiceal orifice, and rectum were photographed. Scope In: [id number] PM Scope Out: 3:02:34 PM Scope Withdrawal Time: 0 hours 12 minutes 45 seconds  Total Procedure Duration: 0 hours 16 minutes 16 seconds  Findings:                 The digital rectal exam was normal.                           A few small-mouthed diverticula were found in the                            sigmoid colon.                           The exam was otherwise without abnormality on                            direct and retroflexion views. Complications:            No immediate complications. Estimated Blood Loss:     Estimated blood loss: none. Impression:               - Diverticulosis in the sigmoid colon.                           -  The examination was otherwise normal on direct                            and retroflexion views.                           - No specimens collected. Recommendation:           - Patient has a contact number available for                            emergencies. The signs and symptoms of potential                            delayed complications were discussed with the                            patient. Return to normal activities tomorrow.                            Written discharge instructions were provided to the                            patient.                           - Resume previous diet.                           - Continue present medications.                           - Repeat colonoscopy in 5 years for surveillance. Jerene Bears, MD 01/20/2021 3:09:32 PM This report has  been signed electronically.

## 2021-01-22 ENCOUNTER — Telehealth: Payer: Self-pay | Admitting: *Deleted

## 2021-01-22 ENCOUNTER — Telehealth: Payer: Self-pay

## 2021-01-22 NOTE — Telephone Encounter (Signed)
  Follow up Call-  Call back number 01/20/2021  Post procedure Call Back phone  # [phone]  Permission to leave phone message Yes  Some recent data might be hidden     Patient questions:  Do you have a fever, pain , or abdominal swelling? No. Pain Score  0 *  Have you tolerated food without any problems? Yes.    Have you been able to return to your normal activities? Yes.    Do you have any questions about your discharge instructions: Diet   No. Medications  No. Follow up visit  No.  Do you have questions or concerns about your Care? No.  Actions: * If pain score is 4 or above: No action needed, pain <4.  Have you developed a fever since your procedure? no  2.   Have you had an respiratory symptoms (SOB or cough) since your procedure? no  3.   Have you tested positive for COVID 19 since your procedure no  4.   Have you had any family members/close contacts diagnosed with the COVID 19 since your procedure?  no   If yes to any of these questions please route to Joylene John, RN and Joella Prince, RN

## 2021-01-22 NOTE — Telephone Encounter (Signed)
First post procedure follow up , left message.

## 2021-01-28 ENCOUNTER — Other Ambulatory Visit: Payer: Self-pay | Admitting: Family Medicine

## 2021-01-28 DIAGNOSIS — J4520 Mild intermittent asthma, uncomplicated: Secondary | ICD-10-CM

## 2021-03-29 ENCOUNTER — Other Ambulatory Visit (HOSPITAL_COMMUNITY): Payer: Self-pay | Admitting: Cardiology

## 2021-04-08 ENCOUNTER — Other Ambulatory Visit: Payer: Self-pay | Admitting: Family Medicine

## 2021-04-10 ENCOUNTER — Other Ambulatory Visit: Payer: Self-pay | Admitting: Family Medicine

## 2021-04-30 ENCOUNTER — Other Ambulatory Visit (HOSPITAL_COMMUNITY): Payer: Self-pay | Admitting: Cardiology

## 2021-05-12 ENCOUNTER — Other Ambulatory Visit (HOSPITAL_COMMUNITY): Payer: Self-pay | Admitting: *Deleted

## 2021-05-12 ENCOUNTER — Other Ambulatory Visit (HOSPITAL_COMMUNITY): Payer: Self-pay | Admitting: Cardiology

## 2021-05-12 MED ORDER — ATORVASTATIN CALCIUM 80 MG PO TABS: 80.0000 mg | ORAL_TABLET | Freq: Every day | ORAL | 11 refills | Status: DC

## 2021-05-12 NOTE — Telephone Encounter (Signed)
Per Dr Aundra Dubin, he has been in communication with pt and pcp regarding lipid panel, refill sent in per his order

## 2021-06-16 NOTE — Progress Notes (Signed)
Phone: [phone]   Subjective:  Patient presents today for their annual follow-up-patient is self-pay. Chief complaint-noted.   See problem oriented charting-  The following were reviewed and entered/updated in epic: Past Medical History:  Diagnosis Date   Allergy    Anxiety    Asthma    BPPV (benign paroxysmal positional vertigo)    X2 2022   Bronchopneumonia    Recurrent   COVID-19    X2   Hyperlipidemia    Shingles    Patient Active Problem List   Diagnosis Date Noted   Hyperlipidemia 09/11/2012    Priority: Medium    Bronchitis with asthma, acute 08/24/2007    Priority: Medium    Asthma, mild intermittent, well-controlled 05/16/2007    Priority: Medium    Male pattern baldness 04/13/2015    Priority: Low   Hamstring tendinitis of right thigh 09/12/2014    Priority: Low   Medial epicondylitis of right elbow 06/30/2011    Priority: Low   Seasonal and perennial allergic rhinitis 05/16/2007    Priority: Low   Past Surgical History:  Procedure Laterality Date   COLONOSCOPY     POLYPECTOMY     torn labrum right shoulder  06/06/2006   VASECTOMY  06/06/2005    Family History  Problem Relation Age of Onset   Healthy Mother    Hyperlipidemia Father    Hyperlipidemia Sister    Healthy Brother    Heart disease Maternal Grandmother    Heart disease Maternal Grandfather    Allergic rhinitis Daughter    Colon cancer Neg Hx    Esophageal cancer Neg Hx    Pancreatic cancer Neg Hx    Rectal cancer Neg Hx    Stomach cancer Neg Hx    Colon polyps Neg Hx     Medications- reviewed and updated Current Outpatient Medications  Medication Sig Dispense Refill   albuterol (VENTOLIN HFA) 108 (90 Base) MCG/ACT inhaler Inhale 2 puffs into the lungs every 6 (six) hours as needed for wheezing or shortness of breath (rescue inhaler). 18 g 3   aspirin EC 81 MG tablet Take 81 mg by mouth daily.     Fexofenadine HCl (ALLEGRA ALLERGY PO) Take by mouth as needed.      finasteride (PROSCAR) 5 MG tablet TAKE ONE FOURTH TABLET BY MOUTH DAILY 25 tablet 3   Fluticasone-Salmeterol 55-14 MCG/ACT AEPB INHALKE 1 PUFF INTO LUNGS TWICE DAILY 3 each 3   melatonin 3 MG TABS tablet Take 3 mg by mouth at bedtime as needed.     atorvastatin (LIPITOR) 80 MG tablet Take 1 tablet (80 mg total) by mouth daily. 90 tablet 3   montelukast (SINGULAIR) 10 MG tablet Take 1 tablet (10 mg total) by mouth at bedtime. 90 tablet 3   No current facility-administered medications for this visit.    Allergies-reviewed and updated Allergies  Allergen Reactions   Minocycline Rash    Erythema Multiforme    Social History   Social History Narrative   Married. 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 Enjoys work. Worked in [place] for 7 years before that. [employer] for 5 years.    Father is retired Development worker, international aid is physician (endocrinologist)      Hobbies: tennis and raquetball, time with kids and family, church- Probation officer in past, now [employer]  Objective  Objective:  BP 100/82    Pulse (!) 56    Temp 97.6 F (36.4 C)    Ht 5\' 11"  (1.803 m)    Wt 178 lb (80.7 kg)    SpO2 95%    BMI 24.83 kg/m  Gen: NAD, resting comfortably HEENT: Mucous membranes are moist. Oropharynx normal Neck: no thyromegaly CV: RRR no murmurs rubs or gallops Lungs: CTAB no crackles, wheeze, rhonchi Abdomen: soft/nontender/nondistended/normal bowel sounds. No rebound or guarding.  Ext: no edema Skin: warm, dry Neuro: grossly normal, moves all extremities, PERRLA   Assessment and Plan  56 y.o. male presenting for annual follow-up and health counseling Health Maintenance counseling: 1. Anticipatory guidance: Patient counseled regarding regular dental exams - yearly typically- advised q6 months, eye exams -advised at least every 5 years (not lately and  encouraged)-he wears readers-at his age regular would be reasonable as well,  avoiding smoking and second hand smoke , limiting alcohol to 2 beverages per day-he does not drink alcohol-stopped 4-5 years ago to be example for kids. No illicit drugs.   2. Risk factor reduction:  Advised patient of need for regular exercise and diet rich and fruits and vegetables to reduce risk of heart attack and stroke.  Exercise- tennis or racquetball 3 days a week and walking 2 more days a week.  Diet/weight management-healthy weight- but feels could make dietary improvement.  Wt Readings from Last 3 Encounters:  06/18/21 178 lb (80.7 kg)  01/20/21 175 lb (79.4 kg)  01/06/21 175 lb (79.4 kg)  3. Immunizations/screenings/ancillary studies DISCUSSED:  -COVID booster vaccine #4- declines for now. Has had twice in 2020 and sometime in 2021 -Shingrix vaccination #1- still considering- patient had shingles Mid 2020 -Prevnar-20 vaccination #1- recommended due to asthma- today -Hepatitis C Screening - opted out as self-pay Immunization History  Administered Date(s) Administered   Influenza Split 03/07/2011, 05/17/2012   Influenza Whole 02/22/2008, 03/03/2009   Influenza, Quadrivalent, Recombinant, Inj, Pf 03/16/2018   Influenza,inj,Quad PF,6+ Mos 03/25/2013, 05/08/2014, 04/13/2015, 03/21/2019, 03/06/2021   Influenza-Unspecified 04/07/2016, 03/31/2017, 03/25/2020   PFIZER(Purple Top)SARS-COV-2 Vaccination 08/10/2019, 08/31/2019, 04/04/2020   PPD Test 06/27/2018   Tdap 03/25/2013  4. Prostate cancer screening-continue to trend PSA given he is on finasteride -prior low risk trend Lab Results  Component Value Date   PSA 0.21 06/17/2020   PSA 0.20 06/13/2019   PSA 0.18 04/17/2018   5. Colon cancer screening -  tubular adenoma July 11, 2017-he had colonoscopy completed on 01/20/2021 with 5-year repeat planned-no polyps on this check.  6. Skin cancer screening-has seen dermatology  last year- advised regular  sunscreen use. Denies worrisome, changing, or new skin lesions.  7. Smoking associated screening (lung cancer screening, AAA screen 65-75, UA)- Never smoker 8. STD screening - opts out as monogamous  Status of chronic or acute concerns   #hyperlipidemia S: Medication: atorvastatin 80Mg  daily (increased to this after coronary calcium of 300 in 03/24/20 which is 94% for age), aspirin 81 mg daily -no chest pain or shortness of breath Lab Results  Component Value Date   CHOL 94 06/17/2020   HDL 33.70 (L) 06/17/2020   LDLCALC 49 06/17/2020   TRIG 56.0 06/17/2020   CHOLHDL 3 06/17/2020   A/P: Excellent control last check-update with labs again today  # Asthma/allergies S: Maintenance Medication: allegra (more lately in winter), advair generic 1 puff twice daily, Singulair 10Mg -helped allergies as well as asthma As needed medication: Albuterol usage- exceedingly rare  A/P: doing very well on  both- continue current meds  #BPPV- did PT in April - helpful but can still get very mild symptoms. Not doing his exercises recently but discussed he could try this. Could also refer back to PT. Far better than at its worst.    #Male pattern baldness-uses 1.25 mg of finasteride daily.  We will trend PSA.  Recommended follow up: Return in about 1 year (around 06/18/2022) for follow up- or sooner if needed.  Lab/Order associations: fasting   ICD-10-CM   1. Hyperlipidemia, unspecified hyperlipidemia type  E78.5 CBC with Differential/Platelet    Comprehensive metabolic panel    Lipid panel    2. Asthma, mild intermittent, well-controlled  J45.20     3. Seasonal and perennial allergic rhinitis  J30.89    J30.2     4. Asthma in adult, mild intermittent, uncomplicated  M38.17 montelukast (SINGULAIR) 10 MG tablet    5. Screening for prostate cancer  Z12.5 PSA    6. High risk medication use  Z79.899 PSA     Meds ordered this encounter  Medications   atorvastatin (LIPITOR) 80 MG tablet    Sig:  Take 1 tablet (80 mg total) by mouth daily.    Dispense:  90 tablet    Refill:  3   montelukast (SINGULAIR) 10 MG tablet    Sig: Take 1 tablet (10 mg total) by mouth at bedtime.    Dispense:  90 tablet    Refill:  3   I,Jada Bradford,acting as a scribe for Garret Reddish, MD.,have documented all relevant documentation on the behalf of Garret Reddish, MD,as directed by  Garret Reddish, MD while in the presence of Garret Reddish, MD.  I, Garret Reddish, MD, have reviewed all documentation for this visit. The documentation on 06/18/21 for the exam, diagnosis, procedures, and orders are all accurate and complete.  Return precautions advised.  Garret Reddish, MD

## 2021-06-16 NOTE — Patient Instructions (Addendum)
Health Maintenance Due  Topic Date Due   Pneumococcal Vaccine - prevnar 11 today Never done   Hepatitis C Screening - hold off as self pay Never done   Zoster Vaccines- Shingrix (1 of 2)-consider at later date Never done   Please stop by lab before you go If you have mychart- we will send your results within 3 business days of [place] receiving them.  If you do not have mychart- we will call you about results within 5 business days of [place] receiving them.  *please also note that you will see labs on mychart as soon as they post. I will later go in and write notes on them- will say "notes from Dr. Yong Channel"   Recommended follow up: Return in about 1 year (around 06/18/2022) for follow up- or sooner if needed.

## 2021-06-18 ENCOUNTER — Ambulatory Visit (INDEPENDENT_AMBULATORY_CARE_PROVIDER_SITE_OTHER): Payer: Self-pay | Admitting: Family Medicine

## 2021-06-18 ENCOUNTER — Encounter: Payer: Self-pay | Admitting: Family Medicine

## 2021-06-18 ENCOUNTER — Other Ambulatory Visit: Payer: Self-pay

## 2021-06-18 VITALS — BP 100/82 | HR 56 | Temp 97.6°F | Ht 71.0 in | Wt 178.0 lb

## 2021-06-18 DIAGNOSIS — J302 Other seasonal allergic rhinitis: Secondary | ICD-10-CM

## 2021-06-18 DIAGNOSIS — E785 Hyperlipidemia, unspecified: Secondary | ICD-10-CM

## 2021-06-18 DIAGNOSIS — Z125 Encounter for screening for malignant neoplasm of prostate: Secondary | ICD-10-CM

## 2021-06-18 DIAGNOSIS — Z0000 Encounter for general adult medical examination without abnormal findings: Secondary | ICD-10-CM

## 2021-06-18 DIAGNOSIS — Z23 Encounter for immunization: Secondary | ICD-10-CM

## 2021-06-18 DIAGNOSIS — J3089 Other allergic rhinitis: Secondary | ICD-10-CM

## 2021-06-18 DIAGNOSIS — Z79899 Other long term (current) drug therapy: Secondary | ICD-10-CM

## 2021-06-18 DIAGNOSIS — J4520 Mild intermittent asthma, uncomplicated: Secondary | ICD-10-CM

## 2021-06-18 LAB — LIPID PANEL
Cholesterol: 109 mg/dL (ref 0–200)
HDL: 40.2 mg/dL (ref >39.00)
LDL Cholesterol: 60 mg/dL (ref 0–99)
NonHDL: 68.32
Total CHOL/HDL Ratio: 3
Triglycerides: 43 mg/dL (ref 0.0–149.0)
VLDL: 8.6 mg/dL (ref 0.0–40.0)

## 2021-06-18 LAB — COMPREHENSIVE METABOLIC PANEL
ALT: 19 U/L (ref 0–53)
AST: 19 U/L (ref 0–37)
Albumin: 4.6 g/dL (ref 3.5–5.2)
Alkaline Phosphatase: 73 U/L (ref 39–117)
BUN: 17 mg/dL (ref 6–23)
CO2: 29 mEq/L (ref 19–32)
Calcium: 9.5 mg/dL (ref 8.4–10.5)
Chloride: 100 mEq/L (ref 96–112)
Creatinine, Ser: 0.99 mg/dL (ref 0.40–1.50)
GFR: 85.93 mL/min (ref >60.00)
Glucose, Bld: 85 mg/dL (ref 70–99)
Potassium: 4.5 mEq/L (ref 3.5–5.1)
Sodium: 139 mEq/L (ref 135–145)
Total Bilirubin: 2 mg/dL — ABNORMAL HIGH (ref 0.2–1.2)
Total Protein: 7.2 g/dL (ref 6.0–8.3)

## 2021-06-18 LAB — CBC WITH DIFFERENTIAL/PLATELET
Basophils Absolute: 0 10*3/uL (ref 0.0–0.1)
Basophils Relative: 0.4 % (ref 0.0–3.0)
Eosinophils Absolute: 0.2 10*3/uL (ref 0.0–0.7)
Eosinophils Relative: 2.3 % (ref 0.0–5.0)
HCT: 50.1 % (ref 39.0–52.0)
Hemoglobin: 17 g/dL (ref 13.0–17.0)
Lymphocytes Relative: 30.8 % (ref 12.0–46.0)
Lymphs Abs: 2.3 10*3/uL (ref 0.7–4.0)
MCHC: 33.9 g/dL (ref 30.0–36.0)
MCV: 92.2 fl (ref 78.0–100.0)
Monocytes Absolute: 0.7 10*3/uL (ref 0.1–1.0)
Monocytes Relative: 9.9 % (ref 3.0–12.0)
Neutro Abs: 4.2 10*3/uL (ref 1.4–7.7)
Neutrophils Relative %: 56.6 % (ref 43.0–77.0)
Platelets: 157 10*3/uL (ref 150.0–400.0)
RBC: 5.43 Mil/uL (ref 4.22–5.81)
RDW: 12.8 % (ref 11.5–15.5)
WBC: 7.4 10*3/uL (ref 4.0–10.5)

## 2021-06-18 LAB — PSA: PSA: 0.19 ng/mL (ref 0.10–4.00)

## 2021-06-18 MED ORDER — ATORVASTATIN CALCIUM 80 MG PO TABS: 80.0000 mg | ORAL_TABLET | Freq: Every day | ORAL | 3 refills | Status: DC

## 2021-06-18 MED ORDER — MONTELUKAST SODIUM 10 MG PO TABS: 10.0000 mg | ORAL_TABLET | Freq: Every day | ORAL | 3 refills | Status: DC

## 2021-06-18 NOTE — Addendum Note (Signed)
Addended by: Clyde Lundborg A on: 06/18/2021 09:01 AM   Modules accepted: Orders

## 2021-08-23 ENCOUNTER — Telehealth (HOSPITAL_COMMUNITY): Payer: Self-pay | Admitting: *Deleted

## 2021-08-23 DIAGNOSIS — R079 Chest pain, unspecified: Secondary | ICD-10-CM

## 2021-08-23 NOTE — Telephone Encounter (Signed)
Per Dr Aundra Dubin, pt has been having CP off/on, needs EKG and GXT to eval.  ? ?Will check on cost and call pt with info ?

## 2021-08-24 NOTE — Telephone Encounter (Signed)
Per [facility], approx cost of GXT is $750 with about 55% discount for self-pay. ? ?Pt is aware and agreeable to proceed, order placed. ? ?Dr Aundra Dubin please sign off on Attestation ?

## 2021-08-26 ENCOUNTER — Telehealth (HOSPITAL_COMMUNITY): Payer: Self-pay | Admitting: *Deleted

## 2021-08-26 NOTE — Telephone Encounter (Signed)
Close encounter 

## 2021-08-27 ENCOUNTER — Ambulatory Visit (HOSPITAL_COMMUNITY)
Admission: RE | Admit: 2021-08-27 | Discharge: 2021-08-27 | Disposition: A | Discharge status: Home / Self Care | Payer: Self-pay | Source: Ambulatory Visit | Attending: Cardiovascular Disease | Admitting: Cardiovascular Disease

## 2021-08-27 ENCOUNTER — Other Ambulatory Visit: Payer: Self-pay

## 2021-08-27 DIAGNOSIS — R079 Chest pain, unspecified: Secondary | ICD-10-CM | POA: Insufficient documentation

## 2021-08-27 LAB — EXERCISE TOLERANCE TEST
Angina Index: 0
Base ST Depression (mm): 0 mm
Duke Treadmill Score: 15
Estimated workload: 17.5
Exercise duration (min): 14 min
Exercise duration (sec): 36 s
MPHR: 165 {beats}/min
Peak HR: 169 {beats}/min
Percent HR: 102 %
Rest HR: 57 {beats}/min
ST Depression (mm): 0 mm

## 2021-08-30 ENCOUNTER — Encounter (HOSPITAL_COMMUNITY): Payer: Self-pay | Admitting: Cardiology

## 2021-08-30 NOTE — Progress Notes (Signed)
? ?Phone [phone] ?In person visit ?  ?Subjective:  ? ?[name] is a 56 y.o. year old very pleasant male patient who presents for/with See problem oriented charting ?Chief Complaint  ?Patient presents with  ? Anxiety  ?  Pt states he has tightness in his chest, has seen cardiologist and has had stress tests and other test done and cardiology does not think its heart related.   ? ?This visit occurred during the SARS-CoV-2 public health emergency.  Safety protocols were in place, including screening questions prior to the visit, additional usage of staff PPE, and extensive cleaning of exam room while observing appropriate contact time as indicated for disinfecting solutions.  ? ?Past Medical History-  ?Patient Active Problem List  ? Diagnosis Date Noted  ? Hyperlipidemia 09/11/2012  ?  Priority: Medium   ? Bronchitis with asthma, acute 08/24/2007  ?  Priority: Medium   ? Asthma, mild intermittent, well-controlled 05/16/2007  ?  Priority: Medium   ? 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  Priority: Low  ? Hamstring tendinitis of right thigh 09/12/2014  ?  Priority: Low  ? Medial epicondylitis of right elbow 06/30/2011  ?  Priority: Low  ? Seasonal and perennial allergic rhinitis 05/16/2007  ?  Priority: Low  ? ? ?Medications- reviewed and updated ?Current Outpatient Medications  ?Medication Sig Dispense Refill  ? albuterol (VENTOLIN HFA) 108 (90 Base) MCG/ACT inhaler Inhale 2 puffs into the lungs every 6 (six) hours as needed for wheezing or shortness of breath (rescue inhaler). 18 g 3  ? aspirin EC 81 MG tablet Take 81 mg by mouth daily.    ? atorvastatin (LIPITOR) 80 MG tablet Take 1 tablet (80 mg total) by mouth daily. 90 tablet 3  ? Fexofenadine HCl (ALLEGRA ALLERGY PO) Take by mouth as needed.    ? finasteride (PROSCAR) 5 MG tablet TAKE ONE FOURTH TABLET BY MOUTH DAILY 25 tablet 3  ? Fluticasone-Salmeterol 55-14 MCG/ACT AEPB INHALKE 1 PUFF INTO LUNGS TWICE DAILY 3 each 3  ? melatonin 3 MG TABS tablet Take 3  mg by mouth at bedtime as needed.    ? montelukast (SINGULAIR) 10 MG tablet Take 1 tablet (10 mg total) by mouth at bedtime. 90 tablet 3  ? ?No current facility-administered medications for this visit.  ? ?  ?Objective:  ?BP 90/62   Pulse (!) 45   Temp 97.8 ?F (36.6 ?C)   Ht '5\' 11"'$  (1.803 m)   Wt 179 lb 12.8 oz (81.6 kg)   SpO2 95%   BMI 25.08 kg/m?  ?Gen: NAD, resting comfortably ?CV: Bradycardic but regular no murmurs rubs or gallops ?Lungs: CTAB no crackles, wheeze, rhonchi ?Abdomen: soft/nontender/nondistended/normal bowel sounds. No rebound or guarding.  ?Ext: no edema or calf pain ?Skin: warm, dry ?  ? ?Assessment and Plan  ? ?# Atypical chest pain ?S: Patient is seen cardiology related to tightness in his chest-exercise tolerance test was low risk completed by Dr. Aundra Dubin.  Previously patient had CT cardiac scoring of 320 21 which was 94th percentile for age and had statin increased at that time ? ?Has had 3 weeks of left sided discomfort- almost like he did a lot of push ups but has not. Pain does not worsen with exercise and no SOB, left arm or neck pain. Has in last 3 days felt short spurts of left neck pain - at times with turning head and at times not turning head. Did an exercise that he did 3 days  ago and wonders if tweaked neck. Hr has remained low. No BP spikes. Feels well overall. No calf pain or swelling. 2/10 pain most of the time. Up to 3/10 with increased stress. Low level constant. No family history DVT/PE.  ? ?Did for a week have more spicy food and he has cut that back. Has not cut back on cookie dough- small amount at night. Tums perhaps mild temporary relief.  ? 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 Tends toward anxiety and worry. Not the best situation with his boss.  ? ?Had been on lexapro in the past during separation/divorce- did well with that.  ?A/P: Patient  with nonexertional chest pain that is not relieved by rest located in left side of his chest not typical for angina.  Does have elevated CT cardiac scoring but score has been less than 400 and had recent low risk stress test.  On CT cardiac scoring in 2021 no evidence of aneurysm-he is a never smoker with typically lower blood pressure-I think risk of aneurysm is low.  No calf pain or edema to suggest DVT-I think risk of PE is low-no family history of clotting issues.  There was some consideration of doing CT coronary morphology and that is a consideration if has no improvement with the below plan.  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 ?I think most likely this is either acid reflux or anxiety or combination of both of these ?-He opted to trial omeprazole 20 mg daily for the next month to 2 months-suspected poorly controlled GERD-also discussed some improvements in diet ?-If no improvement he can reach out and we can start Lexapro-has been on in the past and reports in general he has a high performer but also high anxiety-we discussed trying 10 mg if needed ?- Obviously if he had new or worsening symptoms should let us know him ? ?#hyperlipidemia ?S: Medication: atorvastatin '80Mg'$  daily (increased to this after coronary calcium of 300 in 03/24/20 which is 94% for age), aspirin 81 mg daily ?Lab Results  ?Component Value Date  ? CHOL 109 06/18/2021  ? HDL 40.20 06/18/2021  ? [place] 60 06/18/2021  ? TRIG 43.0 06/18/2021  ? CHOLHDL 3 06/18/2021  ? A/P: Lipids have been at goal with LDL under 70-continue current medication ?  ?Recommended follow up: No follow-ups on file. ?Future Appointments  ?Date Time Provider [address]  ?06/24/2022  8:00 AM Masiah Woody, Brayton Mars, MD [facility]  ? ? ?Lab/Order associations: ?  ICD-10-CM   ?1. Hyperlipidemia, unspecified hyperlipidemia type  E78.5   ?  ?2. Asthma, mild intermittent, well-controlled  J45.20   ?  ?3. Encounter for hepatitis C  screening test for low risk patient  Z11.59   ?  ? ? ?No orders of the defined types were placed in this encounter. ? ? ?Return precautions advised.  ?Garret Reddish, MD ? ? ?

## 2021-09-07 ENCOUNTER — Encounter: Payer: Self-pay | Admitting: Family Medicine

## 2021-09-07 ENCOUNTER — Ambulatory Visit (INDEPENDENT_AMBULATORY_CARE_PROVIDER_SITE_OTHER): Payer: Self-pay | Admitting: Family Medicine

## 2021-09-07 VITALS — BP 90/62 | HR 45 | Temp 97.8°F | Ht 71.0 in | Wt 179.8 lb

## 2021-09-07 DIAGNOSIS — E785 Hyperlipidemia, unspecified: Secondary | ICD-10-CM

## 2021-09-07 DIAGNOSIS — R0789 Other chest pain: Secondary | ICD-10-CM

## 2021-09-07 DIAGNOSIS — J4520 Mild intermittent asthma, uncomplicated: Secondary | ICD-10-CM

## 2021-09-07 DIAGNOSIS — K219 Gastro-esophageal reflux disease without esophagitis: Secondary | ICD-10-CM

## 2021-09-07 MED ORDER — OMEPRAZOLE 20 MG PO CPDR: 20.0000 mg | DELAYED_RELEASE_CAPSULE | Freq: Every day | ORAL | 2 refills | Status: DC

## 2021-09-07 NOTE — Patient Instructions (Signed)
Lets try omeprazole 20 mg in case this is reflux or a combo or reflux and anxiety ? ?We also discussed starting lexapro 10 mg if you are not improving - you can message me about this ? ?Recommended follow up: update me in 1 month regardless ?

## 2021-12-02 ENCOUNTER — Other Ambulatory Visit: Payer: Self-pay | Admitting: Family Medicine

## 2022-01-08 ENCOUNTER — Other Ambulatory Visit: Payer: Self-pay | Admitting: Family Medicine

## 2022-02-28 ENCOUNTER — Encounter: Payer: Self-pay | Admitting: *Deleted

## 2022-04-17 ENCOUNTER — Other Ambulatory Visit: Payer: Self-pay | Admitting: Family Medicine

## 2022-05-19 ENCOUNTER — Encounter: Payer: Self-pay | Admitting: *Deleted

## 2022-05-20 ENCOUNTER — Ambulatory Visit (INDEPENDENT_AMBULATORY_CARE_PROVIDER_SITE_OTHER): Payer: Self-pay

## 2022-05-20 ENCOUNTER — Ambulatory Visit (INDEPENDENT_AMBULATORY_CARE_PROVIDER_SITE_OTHER): Payer: Self-pay | Admitting: Orthopedic Surgery

## 2022-05-20 DIAGNOSIS — M25561 Pain in right knee: Secondary | ICD-10-CM

## 2022-05-20 DIAGNOSIS — M25461 Effusion, right knee: Secondary | ICD-10-CM

## 2022-05-22 ENCOUNTER — Encounter: Payer: Self-pay | Admitting: Orthopedic Surgery

## 2022-05-22 MED ORDER — LIDOCAINE HCL 1 % IJ SOLN
5.0000 mL | INTRAMUSCULAR | Status: AC | PRN
  Administered 2022-05-20: 5 mL

## 2022-05-22 MED ORDER — METHYLPREDNISOLONE ACETATE 40 MG/ML IJ SUSP
40.0000 mg | INTRAMUSCULAR | Status: AC | PRN
  Administered 2022-05-20: 40 mg via INTRA_ARTICULAR

## 2022-05-22 MED ORDER — BUPIVACAINE HCL 0.25 % IJ SOLN
4.0000 mL | INTRAMUSCULAR | Status: AC | PRN
  Administered 2022-05-20: 4 mL via INTRA_ARTICULAR

## 2022-05-22 NOTE — Progress Notes (Signed)
Office Visit Note   Patient: [name]           Date of Birth: [birth year]           MRN: [id number] Visit Date: 05/20/2022 Requested by: Marin Olp, MD [place],  [place] [id number] PCP: Marin Olp, MD  Subjective: Chief Complaint  Patient presents with   Right Knee - Pain    HPI: [name] is a 56 y.o. male who presents to the office reporting right knee pain.  This has been going on for about 10 days.  He reports a lot of posterior pain and tightness.  Better with rest.  No specific injury.  However I do remember examining him about 5 years ago for similar problem under similar circumstances which was mild effusion medial joint line tenderness.  This resolved with an injection.  He does play a lot of racquetball and pickleball.  Takes ibuprofen.  Uses compression sleeve..                ROS: All systems reviewed are negative as they relate to the chief complaint within the history of present illness.  Patient denies fevers or chills.  Assessment & Plan: Visit Diagnoses:  1. Right knee pain, unspecified chronicity     Plan: Impression is mild varus alignment in the right knee but no significant arthritis.  Statistically this represents a medial meniscal tear which may or may not have an unstable flap component.  He also has a Baker's cyst which is symptomatic.  We aspirated about 30 cc from the right knee and about 15 cc from the Baker's cyst.  MRI scan indicated to evaluate medial meniscal tear of the right knee.  Follow-up after that study.  Follow-Up Instructions: No follow-ups on file.   Orders:  Orders Placed This Encounter  Procedures   XR KNEE 3 VIEW RIGHT   MR Knee Right w/o contrast   No orders of the defined types were placed in this encounter.     Procedures: Large Joint Inj: R knee on 05/20/2022 3:09 PM Indications: diagnostic evaluation, joint swelling and pain Details: 18 G 1.5 in needle, superolateral  approach  Arthrogram: No  Medications: 5 mL lidocaine 1 %; 40 mg methylPREDNISolone acetate 40 MG/ML; 4 mL bupivacaine 0.25 % Outcome: tolerated well, no immediate complications Procedure, treatment alternatives, risks and benefits explained, specific risks discussed. Consent was given by the patient. Immediately prior to procedure a time out was called to verify the correct patient, procedure, equipment, support staff and site/side marked as required. Patient was prepped and draped in the usual sterile fashion.       Clinical Data: No additional findings.  Objective: Vital Signs: There were no vitals taken for this visit.  Physical Exam:  Constitutional: Patient appears well-developed HEENT:  Head: Normocephalic Eyes:EOM are normal Neck: Normal range of motion Cardiovascular: Normal rate Pulmonary/chest: Effort normal Neurologic: Patient is alert Skin: Skin is warm Psychiatric: Patient has normal mood and affect  Ortho Exam: Ortho exam demonstrates normal gait and alignment.  Excellent quad tone and muscle tone in the legs.  Collateral and cruciate ligaments are stable on the right-hand side.  No groin pain on the right with internal and external rotation of the leg.  Pedal pulses palpable.  No other masses lymphadenopathy or skin changes noted in that right knee region.  Range of motion is full.  McMurray compression testing equivocal on the right.  Specialty Comments:  No specialty comments available.  Imaging: No results found.   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   Hamstring tendinitis of right thigh 09/12/2014   Hyperlipidemia 09/11/2012   Medial epicondylitis of right elbow 06/30/2011   Bronchitis with asthma, acute 08/24/2007   Seasonal and perennial allergic rhinitis 05/16/2007   Asthma, mild intermittent, well-controlled 05/16/2007   Past Medical History:  Diagnosis Date   Allergy     Anxiety    Asthma    BPPV (benign paroxysmal positional vertigo)    X2 2022   Bronchopneumonia    Recurrent   COVID-19    X2   Hyperlipidemia    Shingles     Family History  Problem Relation Age of Onset   Healthy Mother    Hyperlipidemia Father    Hyperlipidemia Sister    Healthy Brother    Heart disease Maternal Grandmother    Heart disease Maternal Grandfather    Allergic rhinitis Daughter    Colon cancer Neg Hx    Esophageal cancer Neg Hx    Pancreatic cancer Neg Hx    Rectal cancer Neg Hx    Stomach cancer Neg Hx    Colon polyps Neg Hx     Past Surgical History:  Procedure Laterality Date   COLONOSCOPY     POLYPECTOMY     torn labrum right shoulder  06/06/2006   VASECTOMY  06/06/2005   Social History   Occupational History   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

## 2022-06-03 ENCOUNTER — Ambulatory Visit
Admission: RE | Admit: 2022-06-03 | Discharge: 2022-06-03 | Disposition: A | Discharge status: Home / Self Care | Payer: No Typology Code available for payment source | Source: Ambulatory Visit | Attending: Orthopedic Surgery | Admitting: Orthopedic Surgery

## 2022-06-03 DIAGNOSIS — M25561 Pain in right knee: Secondary | ICD-10-CM

## 2022-06-07 ENCOUNTER — Encounter (HOSPITAL_BASED_OUTPATIENT_CLINIC_OR_DEPARTMENT_OTHER): Payer: Self-pay | Admitting: Orthopedic Surgery

## 2022-06-07 ENCOUNTER — Other Ambulatory Visit: Payer: Self-pay

## 2022-06-07 NOTE — Progress Notes (Signed)
Hi [name] can you see if we can get this set up for Thursday morning sometime 7:00 at either [facility] or [facility].  I did leave a blue sheet on the desk.  Otherwise I would say it would have to be Monday because unfortunately got to be out of town leaving Thursday afternoon.  I am not sure the time of leaving Thursday afternoon and that may be an option but again I am not 100% positive Best option would be 7:00 in the morning on Thursday.

## 2022-06-07 NOTE — Progress Notes (Signed)
Courtney is an active 57 year old patient with right knee pain.  His knee pain began about 4 weeks ago after an intense several sessions of singles pickleball and singles racquetball.  Developed recurrent swelling as well as medial joint line tenderness.  He has had a knee effusion about 3 to 4 years ago which resolved spontaneously with no further symptoms since that time.  His current symptoms began in earnest after that discrete episode of overuse about 4 weeks ago.  He has tried and failed nonoperative treatment.  The aspiration of his knee helped but the fluid in the knee quickly reaccumulated.  MRI scan is reviewed with Jeneen Rinks.  He has a medial meniscal tear but also has chondral defect on the medial femoral condyle with some edema in the medial femoral condyle.  The defect measures about 1.5 x 6 mm.  I discussed with Sebasthian at length over the phone about the risk and benefits of surgery.  Essentially presented him with 3 algorithmic options beginning from least invasive to most invasive.  In general the least invasive option would be arthroscopy with partial medial meniscectomy and microfracture with bio cartilage.  He would have to be off of his leg nonweightbearing for period of 4 weeks at least.  Would likely delay CPM for at least a week after bio cartilage.  Next option would be osteochondral allograft.  He is active and young.  Third option would be partial knee replacement.  Does have some varus alignment which would need to be considered before that procedure.  In general as active as Rease is in after he discussed it with his father who is also a retired Doctor, general practice he would like to proceed with arthroscopy and microfracture with bio cartilage.  I think that would be a reasonable first step for Jeneen Rinks.  The risk and benefits of the procedure discussed including not limited to the rehab time as well as infection and stiffness.  All questions answered.  We will try to get that set up as soon as  possible.

## 2022-06-08 NOTE — Progress Notes (Signed)
Sent message reminding patient to come in for lab work.

## 2022-06-09 ENCOUNTER — Encounter (HOSPITAL_BASED_OUTPATIENT_CLINIC_OR_DEPARTMENT_OTHER): Payer: Self-pay | Admitting: Orthopedic Surgery

## 2022-06-09 ENCOUNTER — Other Ambulatory Visit: Payer: Self-pay

## 2022-06-09 ENCOUNTER — Encounter (HOSPITAL_BASED_OUTPATIENT_CLINIC_OR_DEPARTMENT_OTHER)
Admission: RE | Disposition: A | Discharge status: Home / Self Care | Payer: Self-pay | Source: Ambulatory Visit | Attending: Orthopedic Surgery

## 2022-06-09 ENCOUNTER — Ambulatory Visit (HOSPITAL_BASED_OUTPATIENT_CLINIC_OR_DEPARTMENT_OTHER): Payer: Self-pay | Admitting: Anesthesiology

## 2022-06-09 ENCOUNTER — Ambulatory Visit (HOSPITAL_BASED_OUTPATIENT_CLINIC_OR_DEPARTMENT_OTHER)
Admission: RE | Admit: 2022-06-09 | Discharge: 2022-06-09 | Disposition: A | Discharge status: Home / Self Care | Payer: Self-pay | Source: Ambulatory Visit | Attending: Orthopedic Surgery | Admitting: Orthopedic Surgery

## 2022-06-09 DIAGNOSIS — S83241A Other tear of medial meniscus, current injury, right knee, initial encounter: Secondary | ICD-10-CM

## 2022-06-09 DIAGNOSIS — J45909 Unspecified asthma, uncomplicated: Secondary | ICD-10-CM | POA: Insufficient documentation

## 2022-06-09 DIAGNOSIS — S83249S Other tear of medial meniscus, current injury, unspecified knee, sequela: Secondary | ICD-10-CM

## 2022-06-09 DIAGNOSIS — Y9359 Activity, other involving other sports and athletics played individually: Secondary | ICD-10-CM | POA: Insufficient documentation

## 2022-06-09 DIAGNOSIS — Z7951 Long term (current) use of inhaled steroids: Secondary | ICD-10-CM | POA: Insufficient documentation

## 2022-06-09 DIAGNOSIS — X500XXA Overexertion from strenuous movement or load, initial encounter: Secondary | ICD-10-CM | POA: Insufficient documentation

## 2022-06-09 DIAGNOSIS — M2419 Other articular cartilage disorders, other specified site: Secondary | ICD-10-CM

## 2022-06-09 DIAGNOSIS — E785 Hyperlipidemia, unspecified: Secondary | ICD-10-CM | POA: Insufficient documentation

## 2022-06-09 DIAGNOSIS — Z01818 Encounter for other preprocedural examination: Secondary | ICD-10-CM

## 2022-06-09 DIAGNOSIS — Z79899 Other long term (current) drug therapy: Secondary | ICD-10-CM | POA: Insufficient documentation

## 2022-06-09 DIAGNOSIS — M94261 Chondromalacia, right knee: Secondary | ICD-10-CM

## 2022-06-09 HISTORY — PX: KNEE ARTHROSCOPY: SHX127

## 2022-06-09 SURGERY — ARTHROSCOPY, KNEE
Anesthesia: General | Site: Knee | Laterality: Right

## 2022-06-09 MED ORDER — POVIDONE-IODINE 10 % EX SWAB
2.0000 | Freq: Once | CUTANEOUS | Status: AC
  Administered 2022-06-09: 2 via TOPICAL

## 2022-06-09 MED ORDER — EPHEDRINE 5 MG/ML INJ
INTRAVENOUS | Status: AC
  Filled 2022-06-09: qty 5

## 2022-06-09 MED ORDER — DEXAMETHASONE SODIUM PHOSPHATE 4 MG/ML IJ SOLN
INTRAMUSCULAR | Status: DC | PRN
  Administered 2022-06-09: 10 mg via INTRAVENOUS

## 2022-06-09 MED ORDER — OXYCODONE-ACETAMINOPHEN 5-325 MG PO TABS: 1.0000 | ORAL_TABLET | ORAL | 0 refills | Status: DC | PRN

## 2022-06-09 MED ORDER — CELECOXIB 100 MG PO CAPS: 100.0000 mg | ORAL_CAPSULE | Freq: Two times a day (BID) | ORAL | 0 refills | Status: DC

## 2022-06-09 MED ORDER — FENTANYL CITRATE (PF) 100 MCG/2ML IJ SOLN
INTRAMUSCULAR | Status: AC
  Filled 2022-06-09: qty 2

## 2022-06-09 MED ORDER — BUPIVACAINE HCL (PF) 0.25 % IJ SOLN
INTRAMUSCULAR | Status: AC
  Filled 2022-06-09: qty 30

## 2022-06-09 MED ORDER — AMISULPRIDE (ANTIEMETIC) 5 MG/2ML IV SOLN
INTRAVENOUS | Status: AC
  Filled 2022-06-09: qty 4

## 2022-06-09 MED ORDER — MIDAZOLAM HCL 2 MG/2ML IJ SOLN
INTRAMUSCULAR | Status: AC
  Filled 2022-06-09: qty 2

## 2022-06-09 MED ORDER — ASPIRIN EC 81 MG PO TBEC: 81.0000 mg | DELAYED_RELEASE_TABLET | Freq: Two times a day (BID) | ORAL | 0 refills | Status: AC

## 2022-06-09 MED ORDER — POVIDONE-IODINE 10 % EX SWAB: 2.0000 | Freq: Once | CUTANEOUS | Status: AC

## 2022-06-09 MED ORDER — POVIDONE-IODINE 7.5 % EX SOLN
Freq: Once | CUTANEOUS | Status: AC
  Filled 2022-06-09: qty 118

## 2022-06-09 MED ORDER — MIDAZOLAM HCL 5 MG/5ML IJ SOLN
INTRAMUSCULAR | Status: DC | PRN
  Administered 2022-06-09: 2 mg via INTRAVENOUS

## 2022-06-09 MED ORDER — METHOCARBAMOL 500 MG PO TABS: 500.0000 mg | ORAL_TABLET | Freq: Three times a day (TID) | ORAL | 1 refills | Status: DC | PRN

## 2022-06-09 MED ORDER — MEPERIDINE HCL 25 MG/ML IJ SOLN: 6.2500 mg | INTRAMUSCULAR | Status: DC | PRN

## 2022-06-09 MED ORDER — OXYCODONE HCL 5 MG PO TABS
5.0000 mg | ORAL_TABLET | Freq: Once | ORAL | Status: AC | PRN
  Administered 2022-06-09: 5 mg via ORAL

## 2022-06-09 MED ORDER — LACTATED RINGERS IV SOLN: INTRAVENOUS | Status: DC

## 2022-06-09 MED ORDER — HYDROMORPHONE HCL 1 MG/ML IJ SOLN: 0.2500 mg | INTRAMUSCULAR | Status: DC | PRN

## 2022-06-09 MED ORDER — OXYCODONE HCL 5 MG PO TABS
ORAL_TABLET | ORAL | Status: AC
  Filled 2022-06-09: qty 1

## 2022-06-09 MED ORDER — ONDANSETRON HCL 4 MG/2ML IJ SOLN
INTRAMUSCULAR | Status: AC
  Filled 2022-06-09: qty 2

## 2022-06-09 MED ORDER — BUPIVACAINE HCL (PF) 0.25 % IJ SOLN
INTRAMUSCULAR | Status: DC | PRN
  Administered 2022-06-09: 30 mL

## 2022-06-09 MED ORDER — CEFAZOLIN SODIUM-DEXTROSE 2-4 GM/100ML-% IV SOLN
2.0000 g | INTRAVENOUS | Status: AC
  Administered 2022-06-09: 2 g via INTRAVENOUS

## 2022-06-09 MED ORDER — MORPHINE SULFATE (PF) 4 MG/ML IV SOLN
INTRAVENOUS | Status: AC
  Filled 2022-06-09: qty 2

## 2022-06-09 MED ORDER — EPINEPHRINE PF 1 MG/ML IJ SOLN
INTRAMUSCULAR | Status: AC
  Filled 2022-06-09: qty 3

## 2022-06-09 MED ORDER — "VISTASEAL 4 ML SINGLE DOSE KIT "
PACK | CUTANEOUS | Status: DC | PRN
  Administered 2022-06-09: 4 mL via TOPICAL

## 2022-06-09 MED ORDER — MORPHINE SULFATE (PF) 4 MG/ML IV SOLN
INTRAVENOUS | Status: DC | PRN
  Administered 2022-06-09: 8 mg via INTRAVENOUS

## 2022-06-09 MED ORDER — ATROPINE SULFATE 0.4 MG/ML IV SOLN
INTRAVENOUS | Status: AC
  Filled 2022-06-09: qty 1

## 2022-06-09 MED ORDER — FENTANYL CITRATE (PF) 100 MCG/2ML IJ SOLN
INTRAMUSCULAR | Status: DC | PRN
  Administered 2022-06-09 (×2): 25 ug via INTRAVENOUS
  Administered 2022-06-09 (×2): 50 ug via INTRAVENOUS
  Administered 2022-06-09 (×2): 25 ug via INTRAVENOUS

## 2022-06-09 MED ORDER — DEXAMETHASONE SODIUM PHOSPHATE 10 MG/ML IJ SOLN
INTRAMUSCULAR | Status: AC
  Filled 2022-06-09: qty 1

## 2022-06-09 MED ORDER — TRANEXAMIC ACID-NACL 1000-0.7 MG/100ML-% IV SOLN
INTRAVENOUS | Status: AC
  Filled 2022-06-09: qty 100

## 2022-06-09 MED ORDER — CLONIDINE HCL (ANALGESIA) 100 MCG/ML EP SOLN
EPIDURAL | Status: AC
  Filled 2022-06-09: qty 10

## 2022-06-09 MED ORDER — ONDANSETRON HCL 4 MG/2ML IJ SOLN
INTRAMUSCULAR | Status: DC | PRN
  Administered 2022-06-09: 4 mg via INTRAVENOUS

## 2022-06-09 MED ORDER — PROMETHAZINE HCL 25 MG/ML IJ SOLN: 6.2500 mg | INTRAMUSCULAR | Status: DC | PRN

## 2022-06-09 MED ORDER — TISSEEL 10 ML EX KIT
PACK | CUTANEOUS | Status: DC | PRN
  Administered 2022-06-09: 4 mL

## 2022-06-09 MED ORDER — AMISULPRIDE (ANTIEMETIC) 5 MG/2ML IV SOLN
10.0000 mg | Freq: Once | INTRAVENOUS | Status: AC | PRN
  Administered 2022-06-09: 10 mg via INTRAVENOUS

## 2022-06-09 MED ORDER — CEFAZOLIN SODIUM-DEXTROSE 2-4 GM/100ML-% IV SOLN
INTRAVENOUS | Status: AC
  Filled 2022-06-09: qty 100

## 2022-06-09 MED ORDER — LIDOCAINE 2% (20 MG/ML) 5 ML SYRINGE
INTRAMUSCULAR | Status: AC
  Filled 2022-06-09: qty 5

## 2022-06-09 MED ORDER — OXYCODONE HCL 5 MG/5ML PO SOLN: 5.0000 mg | Freq: Once | ORAL | Status: AC | PRN

## 2022-06-09 MED ORDER — TRANEXAMIC ACID-NACL 1000-0.7 MG/100ML-% IV SOLN
1000.0000 mg | INTRAVENOUS | Status: AC
  Administered 2022-06-09: 1000 mg via INTRAVENOUS

## 2022-06-09 MED ORDER — SODIUM CHLORIDE 0.9 % IR SOLN
Status: DC | PRN
  Administered 2022-06-09: 4700 mL

## 2022-06-09 MED ORDER — SUCCINYLCHOLINE CHLORIDE 200 MG/10ML IV SOSY
PREFILLED_SYRINGE | INTRAVENOUS | Status: AC
  Filled 2022-06-09: qty 10

## 2022-06-09 MED ORDER — PROPOFOL 10 MG/ML IV BOLUS
INTRAVENOUS | Status: DC | PRN
  Administered 2022-06-09: 200 mg via INTRAVENOUS

## 2022-06-09 MED ORDER — LIDOCAINE HCL (CARDIAC) PF 100 MG/5ML IV SOSY
PREFILLED_SYRINGE | INTRAVENOUS | Status: DC | PRN
  Administered 2022-06-09: 60 mg via INTRAVENOUS

## 2022-06-09 MED ORDER — PHENYLEPHRINE 80 MCG/ML (10ML) SYRINGE FOR IV PUSH (FOR BLOOD PRESSURE SUPPORT)
PREFILLED_SYRINGE | INTRAVENOUS | Status: AC
  Filled 2022-06-09: qty 10

## 2022-06-09 MED ORDER — CLONIDINE HCL (ANALGESIA) 100 MCG/ML EP SOLN
EPIDURAL | Status: DC | PRN
  Administered 2022-06-09: 100 ug via INTRA_ARTICULAR

## 2022-06-09 MED ORDER — EPINEPHRINE PF 1 MG/ML IJ SOLN
INTRAMUSCULAR | Status: AC
  Filled 2022-06-09: qty 1

## 2022-06-09 SURGICAL SUPPLY — 81 items
BANDAGE ESMARK 6X9 LF (GAUZE/BANDAGES/DRESSINGS) IMPLANT
BLADE AVERAGE 25X9 (BLADE) IMPLANT
BLADE EXCALIBUR 4.0X13 (MISCELLANEOUS) IMPLANT
BLADE SURG 10 STRL SS (BLADE) ×1 IMPLANT
BLADE SURG 15 STRL LF DISP TIS (BLADE) ×2 IMPLANT
BLADE SURG 15 STRL SS (BLADE) ×2
BNDG CMPR 9X6 STRL LF SNTH (GAUZE/BANDAGES/DRESSINGS)
BNDG ELASTIC 4X5.8 VLCR STR LF (GAUZE/BANDAGES/DRESSINGS) ×1 IMPLANT
BNDG ELASTIC 6X5.8 VLCR STR LF (GAUZE/BANDAGES/DRESSINGS) ×1 IMPLANT
BNDG ESMARK 6X9 LF (GAUZE/BANDAGES/DRESSINGS)
CANNULA PASSPORT BUTTON 10-40 (CANNULA) IMPLANT
COLLECTOR GRAFT TISSUE (SYSTAGENIX WOUND MANAGEMENT) ×1
CUFF TOURN SGL QUICK 34 (TOURNIQUET CUFF)
CUFF TRNQT CYL 34X4.125X (TOURNIQUET CUFF) IMPLANT
CUTTER BONE 4.0MM X 13CM (MISCELLANEOUS) IMPLANT
DISSECTOR  3.8MM X 13CM (MISCELLANEOUS) ×1
DISSECTOR 3.8MM X 13CM (MISCELLANEOUS) ×1 IMPLANT
DRAPE ARTHROSCOPY W/POUCH 90 (DRAPES) ×1 IMPLANT
DRAPE IMP U-DRAPE 54X76 (DRAPES) ×1 IMPLANT
DRAPE INCISE IOBAN 66X45 STRL (DRAPES) IMPLANT
DRAPE POUCH INSTRU U-SHP 10X18 (DRAPES) ×1 IMPLANT
DRAPE U-SHAPE 47X51 STRL (DRAPES) ×1 IMPLANT
DRSG TEGADERM 2-3/8X2-3/4 SM (GAUZE/BANDAGES/DRESSINGS) IMPLANT
DRSG TEGADERM 4X4.75 (GAUZE/BANDAGES/DRESSINGS) ×2 IMPLANT
DURAPREP 26ML APPLICATOR (WOUND CARE) ×1 IMPLANT
DW OUTFLOW CASSETTE/TUBE SET (MISCELLANEOUS) IMPLANT
ELECT REM PT RETURN 9FT ADLT (ELECTROSURGICAL)
ELECTRODE REM PT RTRN 9FT ADLT (ELECTROSURGICAL) IMPLANT
EXCALIBUR 3.8MM X 13CM (MISCELLANEOUS) IMPLANT
GAUZE 4X4 16PLY ~~LOC~~+RFID DBL (SPONGE) IMPLANT
GAUZE PAD ABD 8X10 STRL (GAUZE/BANDAGES/DRESSINGS) ×1 IMPLANT
GAUZE SPONGE 4X4 12PLY STRL (GAUZE/BANDAGES/DRESSINGS) ×1 IMPLANT
GAUZE XEROFORM 1X8 LF (GAUZE/BANDAGES/DRESSINGS) IMPLANT
GLOVE BIO SURGEON STRL SZ7 (GLOVE) ×1 IMPLANT
GLOVE BIO SURGEON STRL SZ8 (GLOVE) ×1 IMPLANT
GLOVE BIOGEL PI IND STRL 7.0 (GLOVE) ×1 IMPLANT
GLOVE BIOGEL PI IND STRL 8 (GLOVE) ×1 IMPLANT
GOWN STRL REUS W/ TWL LRG LVL3 (GOWN DISPOSABLE) ×1 IMPLANT
GOWN STRL REUS W/ TWL XL LVL3 (GOWN DISPOSABLE) ×1 IMPLANT
GOWN STRL REUS W/TWL LRG LVL3 (GOWN DISPOSABLE) ×1
GOWN STRL REUS W/TWL XL LVL3 (GOWN DISPOSABLE) ×1
GRAFT TISSUE BIOCARTILAGE 1ML (Tissue) IMPLANT
IMMOBILIZER KNEE 22 UNIV (SOFTGOODS) IMPLANT
KIT BIOCARTILAGE LG JOINT MIX (KITS) IMPLANT
MANIFOLD NEPTUNE II (INSTRUMENTS) ×1 IMPLANT
NDL HYPO 18GX1.5 BLUNT FILL (NEEDLE) ×1 IMPLANT
NDL SAFETY ECLIP 18X1.5 (MISCELLANEOUS) ×2 IMPLANT
NDL SPNL 18GX3.5 QUINCKE PK (NEEDLE) IMPLANT
NEEDLE HYPO 18GX1.5 BLUNT FILL (NEEDLE) ×1 IMPLANT
NEEDLE SPNL 18GX3.5 QUINCKE PK (NEEDLE) ×3 IMPLANT
PACK ARTHROSCOPY DSU (CUSTOM PROCEDURE TRAY) ×1 IMPLANT
PACK BASIN DAY SURGERY FS (CUSTOM PROCEDURE TRAY) ×1 IMPLANT
PADDING CAST COTTON 6X4 STRL (CAST SUPPLIES) ×1 IMPLANT
PENCIL SMOKE EVACUATOR (MISCELLANEOUS) IMPLANT
PICK POWER XL 45DEG (MISCELLANEOUS) IMPLANT
PORT APPOLLO RF 90DEGREE MULTI (SURGICAL WAND) IMPLANT
SHEET MEDIUM DRAPE 40X70 STRL (DRAPES) IMPLANT
SLEEVE SCD COMPRESS KNEE MED (STOCKING) ×1 IMPLANT
SUCTION FRAZIER HANDLE 10FR (MISCELLANEOUS) ×1
SUCTION TUBE FRAZIER 10FR DISP (MISCELLANEOUS) IMPLANT
SUT ETHILON 3 0 PS 1 (SUTURE) ×1 IMPLANT
SUT FIBERWIRE #2 38 T-5 BLUE (SUTURE)
SUT MNCRL AB 3-0 PS2 27 (SUTURE) ×1 IMPLANT
SUT VIC AB 0 CT1 27 (SUTURE) ×2
SUT VIC AB 0 CT1 27XBRD ANBCTR (SUTURE) ×2 IMPLANT
SUT VIC AB 1 CT1 27 (SUTURE) ×2
SUT VIC AB 1 CT1 27XBRD ANBCTR (SUTURE) ×2 IMPLANT
SUT VIC AB 2-0 CT1 27 (SUTURE) ×2
SUT VIC AB 2-0 CT1 TAPERPNT 27 (SUTURE) ×2 IMPLANT
SUT VIC AB 2-0 SH 27 (SUTURE)
SUT VIC AB 2-0 SH 27XBRD (SUTURE) IMPLANT
SUT VIC AB 3-0 FS2 27 (SUTURE) IMPLANT
SUT VICRYL 0 UR6 27IN ABS (SUTURE) IMPLANT
SUTURE FIBERWR #2 38 T-5 BLUE (SUTURE) IMPLANT
SYR 5ML LL (SYRINGE) ×1 IMPLANT
SYR TB 1ML 25GX5/8 (SYRINGE) ×1 IMPLANT
TISSUE GRAFT COLLECTOR (SYSTAGENIX WOUND MANAGEMENT) IMPLANT
TOWEL GREEN STERILE FF (TOWEL DISPOSABLE) ×1 IMPLANT
TRAY DSU PREP LF (CUSTOM PROCEDURE TRAY) ×1 IMPLANT
TUBING ARTHROSCOPY IRRIG 16FT (MISCELLANEOUS) ×1 IMPLANT
WRAP KNEE MAXI GEL POST OP (GAUZE/BANDAGES/DRESSINGS) ×1 IMPLANT

## 2022-06-09 NOTE — Anesthesia Preprocedure Evaluation (Signed)
Anesthesia Evaluation  Patient identified by MRN, date of birth, ID band Patient awake    Reviewed: Allergy & Precautions, H&P , NPO status , Patient's Chart, lab work & pertinent test results  Airway Mallampati: II  TM Distance: >3 FB Neck ROM: Full    Dental no notable dental hx.    Pulmonary asthma    Pulmonary exam normal breath sounds clear to auscultation       Cardiovascular negative cardio ROS Normal cardiovascular exam Rhythm:Regular Rate:Normal     Neuro/Psych   Anxiety     negative neurological ROS  negative psych ROS   GI/Hepatic negative GI ROS, Neg liver ROS,,,  Endo/Other  negative endocrine ROS    Renal/GU negative Renal ROS  negative genitourinary   Musculoskeletal negative musculoskeletal ROS (+)    Abdominal   Peds negative pediatric ROS (+)  Hematology negative hematology ROS (+)   Anesthesia Other Findings   Reproductive/Obstetrics negative OB ROS                             Anesthesia Physical Anesthesia Plan  ASA: 2  Anesthesia Plan: General   Post-op Pain Management: Dilaudid IV   Induction: Intravenous  PONV Risk Score and Plan: 2 and Ondansetron, Midazolam and Treatment may vary due to age or medical condition  Airway Management Planned: LMA  Additional Equipment:   Intra-op Plan:   Post-operative Plan: Extubation in OR  Informed Consent: I have reviewed the patients History and Physical, chart, labs and discussed the procedure including the risks, benefits and alternatives for the proposed anesthesia with the patient or authorized representative who has indicated his/her understanding and acceptance.     Dental advisory given  Plan Discussed with: CRNA  Anesthesia Plan Comments:        Anesthesia Quick Evaluation

## 2022-06-09 NOTE — Anesthesia Procedure Notes (Signed)
Procedure Name: LMA Insertion Date/Time: 06/09/2022 7:33 AM  Performed by: Willa Frater, CRNAPre-anesthesia Checklist: Patient identified, Emergency Drugs available, Suction available and Patient being monitored Patient Re-evaluated:Patient Re-evaluated prior to induction Oxygen Delivery Method: Circle system utilized Preoxygenation: Pre-oxygenation with 100% oxygen Induction Type: IV induction Ventilation: Mask ventilation without difficulty LMA: LMA inserted LMA Size: 5.0 Number of attempts: 1 Airway Equipment and Method: Bite block Placement Confirmation: positive ETCO2 Tube secured with: Tape Dental Injury: Teeth and Oropharynx as per pre-operative assessment

## 2022-06-09 NOTE — Anesthesia Postprocedure Evaluation (Signed)
Anesthesia Post Note  Patient: [name]  Procedure(s) Performed: RIGHT KNEE ARTHROSCOPY, MENISCAL DEBRIDEMENT, MICROFRACTURE (Right: Knee)     Patient location during evaluation: PACU Anesthesia Type: General Level of consciousness: awake and alert Pain management: pain level controlled Vital Signs Assessment: post-procedure vital signs reviewed and stable Respiratory status: spontaneous breathing, nonlabored ventilation and respiratory function stable Cardiovascular status: blood pressure returned to baseline and stable Postop Assessment: no apparent nausea or vomiting Anesthetic complications: no   No notable events documented.  Last Vitals:  Vitals:   06/09/22 1055 06/09/22 1100  BP:    Pulse: (!) 58 (!) 57  Resp: (!) 8 10  Temp:    SpO2: 100% 99%    Last Pain:  Vitals:   06/09/22 1100  TempSrc:   PainSc: 0-No pain                 Lynda Rainwater

## 2022-06-09 NOTE — Discharge Instructions (Signed)

## 2022-06-09 NOTE — H&P (Signed)
[name] is an 57 y.o. male.   Chief Complaint: Right knee pain HPI: [name] is an active 57 year old patient with right knee pain.  Symptoms started approximately 1 month ago after an active stretch of pickleball and racquetball.  Patient developed both swelling in the knee along with mechanical symptoms.  Localizes the pain primarily to the medial side of the knee.  No prior issues with the knee in terms of acute swelling or mechanical focal joint line tenderness.  He tried conservative treatment including aspiration and injection as well as activity modification without relief.  Subsequent MRI scanning demonstrated medial meniscal tear primarily in the posterior horn along with chondral defect on the medial femoral condyle.  Presents now for operative management after explanation of risk and benefits.  No personal or family history of DVT or pulmonary embolism.  Past Medical History:  Diagnosis Date   Allergy    Anxiety    Asthma    BPPV (benign paroxysmal positional vertigo)    X2 2022   Bronchopneumonia    Recurrent   COVID-19    X2   Hyperlipidemia    Shingles     Past Surgical History:  Procedure Laterality Date   COLONOSCOPY     POLYPECTOMY     torn labrum right shoulder  06/06/2006   VASECTOMY  06/06/2005    Family History  Problem Relation Age of Onset   Healthy Mother    Hyperlipidemia Father    Hyperlipidemia Sister    Healthy Brother    Heart disease Maternal Grandmother    Heart disease Maternal Grandfather    Allergic rhinitis Daughter    Colon cancer Neg Hx    Esophageal cancer Neg Hx    Pancreatic cancer Neg Hx    Rectal cancer Neg Hx    Stomach cancer Neg Hx    Colon polyps Neg Hx    Social History:  reports that he has never smoked. He has never used smokeless tobacco. He reports current alcohol use of about 1.0 standard drink of alcohol per week. He reports that he does not use drugs.  Allergies:  Allergies  Allergen Reactions   Minocycline Rash     Erythema Multiforme    Medications Prior to Admission  Medication Sig Dispense Refill   atorvastatin (LIPITOR) 80 MG tablet Take 1 tablet (80 mg total) by mouth daily. 90 tablet 3   finasteride (PROSCAR) 5 MG tablet TAKE 1/4 (ONE-FOURTH)TABLET BY MOUTH ONCE DAILY 25 tablet 3   Fluticasone-Salmeterol 55-14 MCG/ACT AEPB INHALE 1 PUFF INTO LUNGS TWICE DAILY 3 each 3   melatonin 3 MG TABS tablet Take 3 mg by mouth at bedtime as needed.     montelukast (SINGULAIR) 10 MG tablet Take 1 tablet (10 mg total) by mouth at bedtime. 90 tablet 3   albuterol (VENTOLIN HFA) 108 (90 Base) MCG/ACT inhaler Inhale 2 puffs into the lungs every 6 (six) hours as needed for wheezing or shortness of breath (rescue inhaler). 18 g 3   aspirin EC 81 MG tablet Take 81 mg by mouth daily.     Fexofenadine HCl (ALLEGRA ALLERGY PO) Take by mouth as needed.     omeprazole (PRILOSEC) 20 MG capsule TAKE ONE CAPSULE BY MOUTH DAILY 30 capsule 2    No results found for this or any previous visit (from the past 48 hour(s)). No results found.  Review of Systems  Musculoskeletal:  Positive for arthralgias.  All other systems reviewed and are negative.   Height  $'5\' 11"'l$  (1.803 m), weight 79.4 kg. Physical Exam Vitals reviewed.  HENT:     Head: Normocephalic.     Nose: Nose normal.     Mouth/Throat:     Mouth: Mucous membranes are moist.  Eyes:     Pupils: Pupils are equal, round, and reactive to light.  Cardiovascular:     Rate and Rhythm: Normal rate.     Pulses: Normal pulses.  Pulmonary:     Effort: Pulmonary effort is normal.  Abdominal:     General: Abdomen is flat.  Musculoskeletal:     Cervical back: Normal range of motion.  Skin:    General: Skin is warm.     Capillary Refill: Capillary refill takes less than 2 seconds.  Neurological:     General: No focal deficit present.     Mental Status: He is alert.  Psychiatric:        Mood and Affect: Mood normal.   Ortho exam demonstrates right knee  effusion with full range of motion.  Medial joint line tenderness is present.  Collateral and cruciate ligaments are stable.  Pedal pulses palpable.  No masses lymphadenopathy or skin changes noted in that right knee region.  Assessment/Plan Impression is right knee medial meniscal tear with chondral defect and significant bone edema in the medial femoral condyle indicating the acute nature of the process.  Plan at this time is right knee arthroscopy with posterior horn medial meniscectomy preserving as much of the negative meniscus is possible.  We will also plan on microfracture and possible bio cartilage application.  Risk and benefits of the procedure are discussed including not limited to infection knee stiffness incomplete pain resolution as well as potential need for further surgery.  Other surgical options are also discussed which are not really indicated at this time as an initial procedure for this particular problem.  The rehabilitative process is also discussed which will include a period of nonweightbearing for [redacted] weeks along with keeping the leg straight for at least the first week after surgery.  Patient understands risk and benefits along with the rehabilitative process goals and rationale.  All questions answered.  Anderson Malta, MD 06/09/2022, 6:32 AM

## 2022-06-09 NOTE — Brief Op Note (Signed)
   06/09/2022  11:49 AM  PATIENT:  [name]  57 y.o. male  PRE-OPERATIVE DIAGNOSIS:  right knee medial meniscal tear, chondral defect  POST-OPERATIVE DIAGNOSIS:  right knee medial meniscal tear, chondral defect  PROCEDURE:  Procedure(s): RIGHT KNEE ARTHROSCOPY, MENISCAL DEBRIDEMENT, MICROFRACTURE,biocartilage  SURGEON:  Surgeon(s): Marlou Sa, Tonna Corner, MD  ASSISTANT: magnant pa  ANESTHESIA:   general  EBL: 5 ml    Total I/O In: 1200 [I.V.:1100; IV Piggyback:100] Out: 15 [Blood:15]  BLOOD ADMINISTERED: none  DRAINS: none   LOCAL MEDICATIONS USED:  none  SPECIMEN:  No Specimen  COUNTS:  YES  TOURNIQUET:   Total Tourniquet Time Documented: Thigh (Right) - 91 minutes Total: Thigh (Right) - 91 minutes   DICTATION: .Other Dictation: Dictation Number [id number]  PLAN OF CARE: Discharge to home after PACU  PATIENT DISPOSITION:  PACU - hemodynamically stable

## 2022-06-09 NOTE — Op Note (Signed)
[name], [name] MEDICAL RECORD NO: [id number] ACCOUNT NO: [id number] DATE OF BIRTH: April 14, 1966 FACILITY: [facility] LOCATION: [facility] PHYSICIAN: Yetta Barre. Marlou Sa, MD  Operative Report   DATE OF PROCEDURE: 06/09/2022  PREOPERATIVE DIAGNOSES:  Right knee medial meniscal tear and chondral defect, medial femoral condyle.  POSTOPERATIVE DIAGNOSES:  Right knee medial meniscal tear and chondral defect, medial femoral condyle.  PROCEDURES:  Right knee arthroscopy with partial medial meniscectomy and BioCartilage placement and microfracture for chondral defect on the medial femoral condyle, which essentially had 3 sections measuring 4 x 4 mm, 12 x 12 mm, and 9 x 10 mm.  INDICATIONS:  This is a 57 year old patient with left knee pain following chondral injury acutely about 1-2 months ago.  He presents for operative management after explanation of risks and benefits.  DESCRIPTION OF PROCEDURE:  The patient was brought to the operating room where general endotracheal anesthesia was induced.  Preoperative antibiotics administered.  Timeout was called.  Right leg was examined under anesthesia.  He had about 7 or 8  degrees of hyperextension with full flexion, good stability to varus and valgus stress and ACL and PCL intact.  Right leg was then pre-scrubbed with alcohol and Betadine, allowed to air dry, prepped with DuraPrep solution and draped in sterile manner.   Timeout was called.  Portals were anesthetized using a combination of Marcaine, morphine and clonidine.  Anterior inferolateral portal was established, anterior inferomedial portal was established under direct visualization.  Diagnostic arthroscopy was  performed.  ACL and PCL intact.  The patient had a small little partial thickness chondral defect in the landing zone of the trochlea.  Patellofemoral joint intact.  No loose bodies in medial or lateral gutter.  The lateral compartment intact with intact  lateral compartment articular cartilage  and intact lateral meniscus.  Medial compartment had changes with a posterior horn medial meniscal tear involving at least 50% of the anterior posterior width of the meniscus.  This was primarily in the posterior  horn.  Debrided back to a stable rim using a combination of basket punch and shaver.  Next, the chondral defect was debrided using a shaver.  Calcified cartilage layer was removed over the area of the chondral defect.  Essentially had 3 lobes to it,  similar to stamen appearance.  These areas underwent microfracture.  The cartilage debridement was called with the shaver.  This was added to the [place] which was mixed with PRP.  This was then placed into the cartilage defect after drying the  joint.  Fibrin glue was then used to seal the BioCartilage into the defect.  This was allowed to harden for 5 minutes.  Knee was then straightened.  Facilitation of the placement was made with the PassPort cannula.  At this time, the Tourniquet was  released, we did use a tourniquet in order to allow for reasonably dry surface on the inside of the knee.  Tourniquet was released at that time and the portals were closed using 2-0 Vicryl, 3-0 nylon.  The portals were anesthetized with the remainder of  the Marcaine, morphine and clonidine.  Portals were closed and impervious dressings applied with Ace wrap, iceman and knee immobilizer.  The patient tolerated the procedure well without immediate complication and transferred to recovery room in stable  condition.  Luke's assistance was required for mobilization and positioning of the leg during the access of the posterior medial portion of the chondral defect.   PUS D: 06/09/2022 11:55:23 am T: 06/09/2022 12:12:00 pm  JOB: [id number]

## 2022-06-09 NOTE — Transfer of Care (Signed)
Immediate Anesthesia Transfer of Care Note  Patient: [name]  Procedure(s) Performed: RIGHT KNEE ARTHROSCOPY, MENISCAL DEBRIDEMENT, MICROFRACTURE (Right: Knee)  Patient Location: PACU  Anesthesia Type:General  Level of Consciousness: sedated  Airway & Oxygen Therapy: Patient Spontanous Breathing and Patient connected to face mask oxygen  Post-op Assessment: Report given to RN and Post -op Vital signs reviewed and stable  Post vital signs: Reviewed and stable  Last Vitals:  Vitals Value Taken Time  BP 112/80 06/09/22 1018  Temp    Pulse 66 06/09/22 1022  Resp 22 06/09/22 1022  SpO2 99 % 06/09/22 1022  Vitals shown include unvalidated device data.  Last Pain:  Vitals:   06/09/22 0639  TempSrc: Oral  PainSc: 3       Patients Stated Pain Goal: 6 (05/24/74 8832)  Complications: No notable events documented.

## 2022-06-10 ENCOUNTER — Encounter (HOSPITAL_BASED_OUTPATIENT_CLINIC_OR_DEPARTMENT_OTHER): Payer: Self-pay | Admitting: Orthopedic Surgery

## 2022-06-17 ENCOUNTER — Telehealth: Payer: Self-pay

## 2022-06-17 ENCOUNTER — Ambulatory Visit (INDEPENDENT_AMBULATORY_CARE_PROVIDER_SITE_OTHER): Payer: Self-pay | Admitting: Orthopedic Surgery

## 2022-06-17 ENCOUNTER — Encounter: Payer: Self-pay | Admitting: Orthopedic Surgery

## 2022-06-17 DIAGNOSIS — M25461 Effusion, right knee: Secondary | ICD-10-CM

## 2022-06-17 NOTE — Progress Notes (Signed)
Post-Op Visit Note   Patient: [name]           Date of Birth: 10-27-1965           MRN: [id number] Visit Date: 06/17/2022 PCP: Marin Olp, MD   Assessment & Plan:  Chief Complaint:  Chief Complaint  Patient presents with   Right Knee - Routine Post Op    R KNEE (surgery date 06-09-22)   Visit Diagnoses:  1. Effusion, right knee     Plan: [name] is a 57 year old patient who underwent left knee arthroscopy with microfracture and bio cartilage for chondral defect of the medial femoral condyle.  Medial meniscal tear also debrided.  He has been nonweightbearing in a knee immobilizer.  On examination today the portal sutures are intact and removed.  Moderate effusion is present which is aspirated.  We aspirated about 40 cc of bloody fluid from the knee.  Fully decompressed.  No calf tenderness negative Homans today.  No thigh tenderness.  He has been doing some straight leg raises.  Plan at this time is to discontinue the knee immobilizer when he sleeping.  Continue with nonweightbearing.  Okay to start CPM machine on Monday 1 hour 3 times a day minimum up to but not beyond 30 degrees.  We will advance weightbearing and range of motion 3 weeks from now.  Follow-Up Instructions: No follow-ups on file.   Orders:  No orders of the defined types were placed in this encounter.  No orders of the defined types were placed in this encounter.   Imaging: No results found.  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   Hamstring tendinitis of right thigh 09/12/2014   Hyperlipidemia 09/11/2012   Medial epicondylitis of right elbow 06/30/2011   Bronchitis with asthma, acute 08/24/2007   Seasonal and perennial allergic rhinitis 05/16/2007   Asthma, mild intermittent, well-controlled 05/16/2007   Past Medical History:  Diagnosis Date   Allergy    Anxiety    Asthma    BPPV (benign paroxysmal positional vertigo)    X2  2022   Bronchopneumonia    Recurrent   COVID-19    X2   Hyperlipidemia    Shingles     Family History  Problem Relation Age of Onset   Healthy Mother    Hyperlipidemia Father    Hyperlipidemia Sister    Healthy Brother    Heart disease Maternal Grandmother    Heart disease Maternal Grandfather    Allergic rhinitis Daughter    Colon cancer Neg Hx    Esophageal cancer Neg Hx    Pancreatic cancer Neg Hx    Rectal cancer Neg Hx    Stomach cancer Neg Hx    Colon polyps Neg Hx     Past Surgical History:  Procedure Laterality Date   COLONOSCOPY     KNEE ARTHROSCOPY Right 06/09/2022   Procedure: RIGHT KNEE ARTHROSCOPY, MENISCAL DEBRIDEMENT, MICROFRACTURE;  Surgeon: Meredith Pel, MD;  Location: [facility];  Service: Orthopedics;  Laterality: Right;   POLYPECTOMY     torn labrum right shoulder  06/06/2006   VASECTOMY  06/06/2005   Social History   Occupational History   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

## 2022-06-17 NOTE — Telephone Encounter (Signed)
CPM needed starting next week 0-30 1 hour 3 times a day increase up to 90 but not beyond.

## 2022-06-20 NOTE — Telephone Encounter (Signed)
Order sent in 

## 2022-06-24 ENCOUNTER — Encounter: Payer: Self-pay | Admitting: Family Medicine

## 2022-06-25 DIAGNOSIS — M94261 Chondromalacia, right knee: Secondary | ICD-10-CM

## 2022-06-25 DIAGNOSIS — S83249S Other tear of medial meniscus, current injury, unspecified knee, sequela: Secondary | ICD-10-CM

## 2022-07-05 ENCOUNTER — Other Ambulatory Visit: Payer: Self-pay | Admitting: Surgical

## 2022-07-08 ENCOUNTER — Ambulatory Visit (INDEPENDENT_AMBULATORY_CARE_PROVIDER_SITE_OTHER): Payer: Self-pay | Admitting: Orthopedic Surgery

## 2022-07-08 DIAGNOSIS — M25461 Effusion, right knee: Secondary | ICD-10-CM

## 2022-07-09 ENCOUNTER — Encounter: Payer: Self-pay | Admitting: Orthopedic Surgery

## 2022-07-09 NOTE — Progress Notes (Signed)
Post-Op Visit Note   Patient: LETCHER Huynh           Date of Birth: August 07, 1965           MRN: [id number] Visit Date: 07/08/2022 PCP: Marin Olp, MD   Assessment & Plan:  Chief Complaint:  Chief Complaint  Patient presents with   Right Knee - Routine Post Op    06/09/22 right knee scope, men deb, microfx   Visit Diagnoses:  1. Effusion, right knee     Plan: Nassim is now 1 month out right knee arthroscopy with meniscal debridement microfracture and bio cartilage.  Taking 2 baby aspirin a day.  Has been doing range of motion exercises and straight leg raises.  No flexion past 90 yet.  On exam no calf tenderness.  Negative Homans.  Has trace effusion.  Knee range of motion feels smooth.  Plan at this time is gradual progression to weightbearing over 2 weeks.  I think he is okay to do a little extension weightbearing for convenience over the next 2 weeks and then he can be weightbearing as tolerated after that.  Okay for range of motion past 90 but no loading past 90.  Most of his lesion was in the posterior aspect of the medial femoral condyle which is going to see the most stress with loaded flexion.  Follow-up in 4 weeks.  Physical therapy note provided for him to work with Barbaraann Barthel.  Follow-Up Instructions: No follow-ups on file.   Orders:  No orders of the defined types were placed in this encounter.  No orders of the defined types were placed in this encounter.   Imaging: No results found.  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   Hamstring tendinitis of right thigh 09/12/2014   Hyperlipidemia 09/11/2012   Medial epicondylitis of right elbow 06/30/2011   Bronchitis with asthma, acute 08/24/2007   Seasonal and perennial allergic rhinitis 05/16/2007   Asthma, mild intermittent, well-controlled 05/16/2007    Past Medical History:  Diagnosis Date   Allergy    Anxiety    Asthma    BPPV (benign paroxysmal positional vertigo)    X2 2022   Bronchopneumonia    Recurrent   COVID-19    X2   Hyperlipidemia    Shingles     Family History  Problem Relation Age of Onset   Healthy Mother    Hyperlipidemia Father    Hyperlipidemia Sister    Healthy Brother    Heart disease Maternal Grandmother    Heart disease Maternal Grandfather    Allergic rhinitis Daughter    Colon cancer Neg Hx    Esophageal cancer Neg Hx    Pancreatic cancer Neg Hx    Rectal cancer Neg Hx    Stomach cancer Neg Hx    Colon polyps Neg Hx     Past Surgical History:  Procedure Laterality Date   COLONOSCOPY     KNEE ARTHROSCOPY Right 06/09/2022   Procedure: RIGHT KNEE ARTHROSCOPY, MENISCAL DEBRIDEMENT, MICROFRACTURE;  Surgeon: Meredith Pel, MD;  Location: [facility];  Service: Orthopedics;  Laterality: Right;   POLYPECTOMY     torn labrum right shoulder  06/06/2006   VASECTOMY  06/06/2005   Social History   Occupational History   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

## 2022-07-11 ENCOUNTER — Telehealth: Payer: Self-pay | Admitting: Family Medicine

## 2022-07-11 MED ORDER — ATORVASTATIN CALCIUM 80 MG PO TABS: 80.0000 mg | ORAL_TABLET | Freq: Every day | ORAL | 3 refills | Status: DC

## 2022-07-11 NOTE — Telephone Encounter (Signed)
Patient is requesting med refill of med below. States he had CPE scheduled for 06/24/22 but had to reschedule due to being immobile following knee surgery. We weren't able to get him rescheduled until June for CPE.Patient would like this filled prior to CPE in June to avoid more Ovs than necessary.    Last OV: 09/07/21 Next OV: 11/21/22 Medication: atorvastatin (LIPITOR) 80 MG tablet  Patient is out of med  Pharmacy:  [facility] [id number], [place], [place] [address] Phone: [phone]  Fax: [phone]

## 2022-07-11 NOTE — Telephone Encounter (Signed)
Rx sent in to below pharmacy.

## 2022-07-21 ENCOUNTER — Other Ambulatory Visit: Payer: Self-pay | Admitting: Family Medicine

## 2022-07-21 DIAGNOSIS — J4520 Mild intermittent asthma, uncomplicated: Secondary | ICD-10-CM

## 2022-07-22 ENCOUNTER — Ambulatory Visit (INDEPENDENT_AMBULATORY_CARE_PROVIDER_SITE_OTHER): Payer: Self-pay | Admitting: Orthopedic Surgery

## 2022-07-22 ENCOUNTER — Encounter: Payer: Self-pay | Admitting: Orthopedic Surgery

## 2022-07-22 DIAGNOSIS — M25461 Effusion, right knee: Secondary | ICD-10-CM

## 2022-07-22 NOTE — Progress Notes (Unsigned)
Post-Op Visit Note   Patient: [name]           Date of Birth: 10-28-1965           MRN: [id number] Visit Date: 07/22/2022 PCP: Marin Olp, MD   Assessment & Plan:  Chief Complaint:  Chief Complaint  Patient presents with   Right Knee - Follow-up    06/09/22 (6w 1d) Andrew Pel, MD Right Knee Arthroscopy, Meniscal Debridement, Microfracture - Right     Visit Diagnoses:  1. Effusion, right knee     Plan: [name] is a 57 year old patient who is now about 5 weeks out right knee arthroscopy with meniscal debridement and microfracture with bio cartilage for full-thickness chondral defect.  Patient is having minimal pain.  He has been doing a little bit of partial weightbearing with crutches over the past week.  Doing physical therapy as well.  Using a knee sleeve at times.  This makes his knee feel more stable.  Doing some pool therapy as well.  Standing in tennis practice.  On examination he has mild effusion with excellent range of motion.  No calf tenderness.  Mild but expected quad atrophy.  Plan at this time is to continue partial weightbearing for another week then transition to weightbearing as tolerated.  Continue with pool exercises and stationary bike to regain quad and hamstring strength.  Follow-up in 4 weeks for clinical recheck.  In general the amount of effusion in the knee is not unexpected for the procedure he had.  Aspiration not indicated at this time.  Follow-Up Instructions: No follow-ups on file.   Orders:  No orders of the defined types were placed in this encounter.  No orders of the defined types were placed in this encounter.   Imaging: No results found.  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   Hamstring tendinitis of right thigh 09/12/2014   Hyperlipidemia 09/11/2012   Medial  epicondylitis of right elbow 06/30/2011   Bronchitis with asthma, acute 08/24/2007   Seasonal and perennial allergic rhinitis 05/16/2007   Asthma, mild intermittent, well-controlled 05/16/2007   Past Medical History:  Diagnosis Date   Allergy    Anxiety    Asthma    BPPV (benign paroxysmal positional vertigo)    X2 2022   Bronchopneumonia    Recurrent   COVID-19    X2   Hyperlipidemia    Shingles     Family History  Problem Relation Age of Onset   Healthy Mother    Hyperlipidemia Father    Hyperlipidemia Sister    Healthy Brother    Heart disease Maternal Grandmother    Heart disease Maternal Grandfather    Allergic rhinitis Daughter    Colon cancer Neg Hx    Esophageal cancer Neg Hx    Pancreatic cancer Neg Hx    Rectal cancer Neg Hx    Stomach cancer Neg Hx    Colon polyps Neg Hx     Past Surgical History:  Procedure Laterality Date   COLONOSCOPY     KNEE ARTHROSCOPY Right 06/09/2022   Procedure: RIGHT KNEE ARTHROSCOPY, MENISCAL DEBRIDEMENT, MICROFRACTURE;  Surgeon: Andrew Pel, MD;  Location: [facility];  Service: Orthopedics;  Laterality: Right;   POLYPECTOMY     torn labrum right shoulder  06/06/2006   VASECTOMY  06/06/2005  Social History   Occupational History   Occupation: Corporate investment banker  Tobacco Use   Smoking status: Never   Smokeless tobacco: Never  Vaping Use   Vaping Use: Never used  Substance and Sexual Activity   Alcohol use: Yes    Alcohol/week: 1.0 standard drink of alcohol    Types: 1 Standard drinks or equivalent per week    Comment: Rarely   Drug use: No   Sexual activity: Not on file

## 2022-08-02 ENCOUNTER — Other Ambulatory Visit: Payer: Self-pay | Admitting: Surgical

## 2022-08-12 ENCOUNTER — Encounter: Payer: Self-pay | Admitting: Orthopedic Surgery

## 2022-08-19 ENCOUNTER — Ambulatory Visit (INDEPENDENT_AMBULATORY_CARE_PROVIDER_SITE_OTHER): Payer: Self-pay | Admitting: Orthopedic Surgery

## 2022-08-19 DIAGNOSIS — M25461 Effusion, right knee: Secondary | ICD-10-CM

## 2022-08-20 ENCOUNTER — Encounter: Payer: Self-pay | Admitting: Orthopedic Surgery

## 2022-08-20 NOTE — Progress Notes (Signed)
Post-Op Visit Note   Patient: [name]           Date of Birth: 1965/07/07           MRN: [id number] Visit Date: 08/19/2022 PCP: Andrew Olp, MD   Assessment & Plan:  Chief Complaint:  Chief Complaint  Patient presents with   Right Knee - Routine Post Op   Visit Diagnoses:  1. Effusion, right knee     Plan: [name] is a patient who is now about 2 months out right knee arthroscopy with microfracture chondral defect along with bio cartilage.  Medial meniscal tear was also present.  Doing therapy 3-4 times a week.  Had pain for several days after his last relatively intense physical therapy session.  Localizing the pain over the past anserine bursa.  He is able to ambulate.  On examination negative Homans no calf tenderness.  Trace effusion in the right knee.  Baker's cyst is palpable but not particularly large.  Range of motion is full with no crepitus.  Still has about 1 to 2 cm of quad atrophy right versus left.  Plan at this time is to modify his activity level and to try to get the leg stronger without loadbearing activities.  Follow-up in 4 weeks for clinical recheck.  Could consider taking 1 or 2 ibuprofen on the days that he exercises including rest days between his therapy days.  Follow-Up Instructions: No follow-ups on file.   Orders:  No orders of the defined types were placed in this encounter.  No orders of the defined types were placed in this encounter.   Imaging: No results found.  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   Hamstring tendinitis of right thigh 09/12/2014   Hyperlipidemia 09/11/2012   Medial epicondylitis of right elbow 06/30/2011   Bronchitis with asthma, acute 08/24/2007   Seasonal and perennial allergic rhinitis 05/16/2007   Asthma, mild intermittent, well-controlled 05/16/2007    Past Medical History:  Diagnosis Date   Allergy    Anxiety    Asthma    BPPV (benign paroxysmal positional vertigo)    X2 2022   Bronchopneumonia    Recurrent   COVID-19    X2   Hyperlipidemia    Shingles     Family History  Problem Relation Age of Onset   Healthy Mother    Hyperlipidemia Father    Hyperlipidemia Sister    Healthy Brother    Heart disease Maternal Grandmother    Heart disease Maternal Grandfather    Allergic rhinitis Daughter    Colon cancer Neg Hx    Esophageal cancer Neg Hx    Pancreatic cancer Neg Hx    Rectal cancer Neg Hx    Stomach cancer Neg Hx    Colon polyps Neg Hx     Past Surgical History:  Procedure Laterality Date   COLONOSCOPY     KNEE ARTHROSCOPY Right 06/09/2022   Procedure: RIGHT KNEE ARTHROSCOPY, MENISCAL DEBRIDEMENT, MICROFRACTURE;  Surgeon: Meredith Pel, MD;  Location: [facility];  Service: Orthopedics;  Laterality: Right;   POLYPECTOMY     torn labrum right shoulder  06/06/2006   VASECTOMY  06/06/2005   Social History   Occupational History   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

## 2022-09-03 ENCOUNTER — Other Ambulatory Visit: Payer: Self-pay | Admitting: Surgical

## 2022-09-16 ENCOUNTER — Ambulatory Visit: Payer: Self-pay | Admitting: Orthopedic Surgery

## 2022-09-23 ENCOUNTER — Encounter: Payer: Self-pay | Admitting: Orthopedic Surgery

## 2022-09-23 ENCOUNTER — Ambulatory Visit (INDEPENDENT_AMBULATORY_CARE_PROVIDER_SITE_OTHER): Payer: Self-pay | Admitting: Orthopedic Surgery

## 2022-09-23 DIAGNOSIS — M25461 Effusion, right knee: Secondary | ICD-10-CM

## 2022-09-23 NOTE — Progress Notes (Signed)
Post-Op Visit Note   Patient: [name]           Date of Birth: [birth year]           MRN: [id number] Visit Date: 09/23/2022 PCP: Shelva Majestic, MD   Assessment & Plan:  Chief Complaint:  Chief Complaint  Patient presents with   Right Knee - Follow-up     right knee arthroscopy with microfracture chondral defect along with bio cartilage   Visit Diagnoses:  1. Effusion, right knee     Plan: [name] is a patient is now 14 weeks out right knee arthroscopy with microfracture and chondral defect with bio cartilage and partial medial meniscectomy.  Been doing a lot of rehab.  Feels like he is making progress.  Took a little time off and decreased the intensity of his exercises over the past 3 days.  He is feeling some better after that.  Is reporting some medial knee pain which is just medial to the patellar tendon and patella itself.  On examination there is no effusion full range of motion.  No grinding or crepitus is present.  No quad atrophy.  Plan at this time is to continue with nonweightbearing quad strengthening exercises.  I think were in the quad strengthening phase which could be helped with eccentric light weight exercising.  Come back in 6 weeks for clinical recheck mostly just to see about the presence or absence of effusion.  He does take ibuprofen but only on the days when he is riding the bike.  He is going to go to about 3 days a week of bike riding and then continue to work on quad strengthening exercises.  Follow-Up Instructions: No follow-ups on file.   Orders:  No orders of the defined types were placed in this encounter.  No orders of the defined types were placed in this encounter.   Imaging: No results found.  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   Hamstring tendinitis of right thigh 09/12/2014    Hyperlipidemia 09/11/2012   Medial epicondylitis of right elbow 06/30/2011   Bronchitis with asthma, acute 08/24/2007   Seasonal and perennial allergic rhinitis 05/16/2007   Asthma, mild intermittent, well-controlled 05/16/2007   Past Medical History:  Diagnosis Date   Allergy    Anxiety    Asthma    BPPV (benign paroxysmal positional vertigo)    X2 2022   Bronchopneumonia    Recurrent   COVID-19    X2   Hyperlipidemia    Shingles     Family History  Problem Relation Age of Onset   Healthy Mother    Hyperlipidemia Father    Hyperlipidemia Sister    Healthy Brother    Heart disease Maternal Grandmother    Heart disease Maternal Grandfather    Allergic rhinitis Daughter    Colon cancer Neg Hx    Esophageal cancer Neg Hx    Pancreatic cancer Neg Hx    Rectal cancer Neg Hx    Stomach cancer Neg Hx    Colon polyps Neg Hx     Past Surgical History:  Procedure Laterality Date   COLONOSCOPY     KNEE ARTHROSCOPY Right 06/09/2022   Procedure: RIGHT KNEE ARTHROSCOPY, MENISCAL DEBRIDEMENT, MICROFRACTURE;  Surgeon: Cammy Copa, MD;  Location: [facility];  Service: Orthopedics;  Laterality: Right;  POLYPECTOMY     torn labrum right shoulder  06/06/2006   VASECTOMY  06/06/2005   Social History   Occupational History   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

## 2022-11-09 ENCOUNTER — Encounter: Payer: Self-pay | Admitting: Orthopedic Surgery

## 2022-11-09 ENCOUNTER — Ambulatory Visit (INDEPENDENT_AMBULATORY_CARE_PROVIDER_SITE_OTHER): Payer: Self-pay | Admitting: Orthopedic Surgery

## 2022-11-09 DIAGNOSIS — M25461 Effusion, right knee: Secondary | ICD-10-CM

## 2022-11-09 NOTE — Progress Notes (Signed)
Post-Op Visit Note   Patient: [name]           Date of Birth: Nov 02, 1965           MRN: [id number] Visit Date: 11/09/2022 PCP: Shelva Majestic, MD   Assessment & Plan:  Chief Complaint:  Chief Complaint  Patient presents with   Right Knee - Follow-up   Visit Diagnoses:  1. Effusion, right knee     Plan: Dexten presents now in follow-up from right knee partial medial meniscectomy and treatment for medial femoral condyle chondromalacia with small kissing lesion on the tibia as well.  He has been doing well making adjustments to his exercise routine.  Has really eliminated a lot of the loadbearing type exercises and is focusing now on resistive work in the water.  Going to [facility] in [place] for medications in the next month.  Still has some medial pain which is not unexpected based on the location of the meniscal pathology and chondral defects.  On examination he has excellent quad tone bilaterally.  Full range of motion with only trace effusion in that right knee and no effusion in the left knee.  Plan at this time is to explore what his envelope of function is in regards to that right knee.  May not be as wide as he would like it to be but I think currently he is functional for most activities of daily living.  Would like to get him to the point where he can participate in racquet sports such as pickleball and tennis perhaps 1-2 times a week.  He will follow-up as needed.  Follow-Up Instructions: No follow-ups on file.   Orders:  No orders of the defined types were placed in this encounter.  No orders of the defined types were placed in this encounter.   Imaging: No results found.  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   Hamstring tendinitis of right thigh 09/12/2014   Hyperlipidemia 09/11/2012   Medial  epicondylitis of right elbow 06/30/2011   Bronchitis with asthma, acute 08/24/2007   Seasonal and perennial allergic rhinitis 05/16/2007   Asthma, mild intermittent, well-controlled 05/16/2007   Past Medical History:  Diagnosis Date   Allergy    Anxiety    Asthma    BPPV (benign paroxysmal positional vertigo)    X2 2022   Bronchopneumonia    Recurrent   COVID-19    X2   Hyperlipidemia    Shingles     Family History  Problem Relation Age of Onset   Healthy Mother    Hyperlipidemia Father    Hyperlipidemia Sister    Healthy Brother    Heart disease Maternal Grandmother    Heart disease Maternal Grandfather    Allergic rhinitis Daughter    Colon cancer Neg Hx    Esophageal cancer Neg Hx    Pancreatic cancer Neg Hx    Rectal cancer Neg Hx    Stomach cancer Neg Hx    Colon polyps Neg Hx     Past Surgical History:  Procedure Laterality Date   COLONOSCOPY     KNEE ARTHROSCOPY Right 06/09/2022   Procedure: RIGHT KNEE ARTHROSCOPY, MENISCAL DEBRIDEMENT, MICROFRACTURE;  Surgeon: Cammy Copa, MD;  Location: [facility];  Service: Orthopedics;  Laterality: Right;   POLYPECTOMY     torn labrum right  shoulder  06/06/2006   VASECTOMY  06/06/2005   Social History   Occupational History   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

## 2022-11-11 ENCOUNTER — Ambulatory Visit: Payer: Self-pay | Admitting: Orthopedic Surgery

## 2022-11-21 ENCOUNTER — Encounter: Payer: Self-pay | Admitting: Family Medicine

## 2022-11-21 ENCOUNTER — Ambulatory Visit (INDEPENDENT_AMBULATORY_CARE_PROVIDER_SITE_OTHER): Payer: Self-pay | Admitting: Family Medicine

## 2022-11-21 VITALS — BP 110/80 | HR 52 | Temp 98.3°F | Ht 71.0 in | Wt 182.8 lb

## 2022-11-21 DIAGNOSIS — E785 Hyperlipidemia, unspecified: Secondary | ICD-10-CM

## 2022-11-21 DIAGNOSIS — J4520 Mild intermittent asthma, uncomplicated: Secondary | ICD-10-CM

## 2022-11-21 DIAGNOSIS — K219 Gastro-esophageal reflux disease without esophagitis: Secondary | ICD-10-CM

## 2022-11-21 DIAGNOSIS — Z125 Encounter for screening for malignant neoplasm of prostate: Secondary | ICD-10-CM

## 2022-11-21 LAB — COMPREHENSIVE METABOLIC PANEL
ALT: 22 U/L (ref 0–53)
AST: 20 U/L (ref 0–37)
Albumin: 4.5 g/dL (ref 3.5–5.2)
Alkaline Phosphatase: 66 U/L (ref 39–117)
BUN: 16 mg/dL (ref 6–23)
CO2: 27 mEq/L (ref 19–32)
Calcium: 9.5 mg/dL (ref 8.4–10.5)
Chloride: 102 mEq/L (ref 96–112)
Creatinine, Ser: 1.05 mg/dL (ref 0.40–1.50)
GFR: 79.27 mL/min (ref >60.00)
Glucose, Bld: 83 mg/dL (ref 70–99)
Potassium: 4 mEq/L (ref 3.5–5.1)
Sodium: 139 mEq/L (ref 135–145)
Total Bilirubin: 1.9 mg/dL — ABNORMAL HIGH (ref 0.2–1.2)
Total Protein: 7.3 g/dL (ref 6.0–8.3)

## 2022-11-21 LAB — LIPID PANEL
Cholesterol: 90 mg/dL (ref 0–200)
HDL: 32.9 mg/dL — ABNORMAL LOW (ref >39.00)
LDL Cholesterol: 48 mg/dL (ref 0–99)
NonHDL: 56.65
Total CHOL/HDL Ratio: 3
Triglycerides: 45 mg/dL (ref 0.0–149.0)
VLDL: 9 mg/dL (ref 0.0–40.0)

## 2022-11-21 LAB — CBC WITH DIFFERENTIAL/PLATELET
Basophils Absolute: 0 10*3/uL (ref 0.0–0.1)
Basophils Relative: 0.4 % (ref 0.0–3.0)
Eosinophils Absolute: 0.1 10*3/uL (ref 0.0–0.7)
Eosinophils Relative: 1 % (ref 0.0–5.0)
HCT: 51.1 % (ref 39.0–52.0)
Hemoglobin: 17.2 g/dL — ABNORMAL HIGH (ref 13.0–17.0)
Lymphocytes Relative: 20.3 % (ref 12.0–46.0)
Lymphs Abs: 1.6 10*3/uL (ref 0.7–4.0)
MCHC: 33.7 g/dL (ref 30.0–36.0)
MCV: 92.9 fl (ref 78.0–100.0)
Monocytes Absolute: 0.6 10*3/uL (ref 0.1–1.0)
Monocytes Relative: 6.9 % (ref 3.0–12.0)
Neutro Abs: 5.7 10*3/uL (ref 1.4–7.7)
Neutrophils Relative %: 71.4 % (ref 43.0–77.0)
Platelets: 158 10*3/uL (ref 150.0–400.0)
RBC: 5.5 Mil/uL (ref 4.22–5.81)
RDW: 13.1 % (ref 11.5–15.5)
WBC: 7.9 10*3/uL (ref 4.0–10.5)

## 2022-11-21 LAB — PSA: PSA: 0.22 ng/mL (ref 0.10–4.00)

## 2022-11-21 MED ORDER — FINASTERIDE 5 MG PO TABS: ORAL_TABLET | ORAL | 3 refills | Status: DC

## 2022-11-21 MED ORDER — FLUTICASONE-SALMETEROL 55-14 MCG/ACT IN AEPB: INHALATION_SPRAY | RESPIRATORY_TRACT | 3 refills | Status: DC

## 2022-11-21 MED ORDER — ATORVASTATIN CALCIUM 80 MG PO TABS: 80.0000 mg | ORAL_TABLET | Freq: Every day | ORAL | 3 refills | Status: DC

## 2022-11-21 MED ORDER — MONTELUKAST SODIUM 10 MG PO TABS: 10.0000 mg | ORAL_TABLET | Freq: Every day | ORAL | 3 refills | Status: DC

## 2022-11-21 NOTE — Progress Notes (Signed)
Phone: [phone]   Subjective:  Patient presents today for their annual physical. Chief complaint-noted.   See problem oriented charting- ROS- full  review of systems was completed and negative  Per full ROS sheet completed by patient  The following were reviewed and entered/updated in epic: Past Medical History:  Diagnosis Date   Allergy    Anxiety    Asthma    BPPV (benign paroxysmal positional vertigo)    X2 2022   Bronchopneumonia    Recurrent   COVID-19    X2   Hyperlipidemia    Shingles    Patient Active Problem List   Diagnosis Date Noted   Hyperlipidemia 09/11/2012    Priority: Medium    Bronchitis with asthma, acute 08/24/2007    Priority: Medium    Asthma, mild intermittent, well-controlled 05/16/2007    Priority: Medium    GERD (gastroesophageal reflux disease) 11/21/2022    Priority: Low   Male pattern baldness 04/13/2015    Priority: Low   Seasonal and perennial allergic rhinitis 05/16/2007    Priority: Low   Chondromalacia, 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   Acute medial meniscal tear, sequela 06/25/2022    Priority: 1.   Hamstring tendinitis of right thigh 09/12/2014    Priority: 1.   Medial epicondylitis of right elbow 06/30/2011    Priority: 1.   Past Surgical History:  Procedure Laterality Date   COLONOSCOPY     KNEE ARTHROSCOPY Right 06/09/2022   Procedure: RIGHT KNEE ARTHROSCOPY, MENISCAL DEBRIDEMENT, MICROFRACTURE;  Surgeon: Cammy Copa, MD;  Location: [facility];  Service: Orthopedics;  Laterality: Right;   POLYPECTOMY     torn labrum right shoulder  06/06/2006   VASECTOMY  06/06/2005    Family History  Problem Relation Age of Onset   Healthy Mother    Hyperlipidemia Father    Hyperlipidemia Sister    Healthy Brother    Heart disease Maternal Grandmother    Heart disease Maternal Grandfather    Allergic rhinitis Daughter    Colon cancer Neg Hx    Esophageal cancer Neg Hx    Pancreatic cancer Neg  Hx    Rectal cancer Neg Hx    Stomach cancer Neg Hx    Colon polyps Neg Hx     Medications- reviewed and updated Current Outpatient Medications  Medication Sig Dispense Refill   albuterol (VENTOLIN HFA) 108 (90 Base) MCG/ACT inhaler Inhale 2 puffs into the lungs every 6 (six) hours as needed for wheezing or shortness of breath (rescue inhaler). 18 g 3   aspirin EC 81 MG tablet Take 1 tablet (81 mg total) by mouth 2 (two) times daily. 30 tablet 0   Fexofenadine HCl (ALLEGRA ALLERGY PO) Take by mouth as needed.     melatonin 3 MG TABS tablet Take 3 mg by mouth at bedtime as needed.     atorvastatin (LIPITOR) 80 MG tablet Take 1 tablet (80 mg total) by mouth daily. 90 tablet 3   finasteride (PROSCAR) 5 MG tablet TAKE 1/4 (ONE-FOURTH)TABLET BY MOUTH ONCE DAILY 25 tablet 3   Fluticasone-Salmeterol 55-14 MCG/ACT AEPB INHALE 1 PUFF INTO LUNGS TWICE DAILY 3 each 3   montelukast (SINGULAIR) 10 MG tablet Take 1 tablet (10 mg total) by mouth at bedtime. 90 tablet 3   No current facility-administered medications for this visit.    Allergies-reviewed and updated Allergies  Allergen Reactions   Minocycline Rash    Erythema Multiforme    Social History  Social History Narrative   Married. 2 children (24 Sharyl Nimrod - engaged, bought hoouse, Corporate investment banker for [employer]) and 20 [place] (f) planning on PA school- in 2023      [employer] (in Airline pilot). Enjoys work. Worked in [place] for 7 years before that. [employer] for 5 years.    Father is retired Environmental health practitioner is physician (endocrinologist)      Hobbies: tennis and raquetball, time with kids and family, church- Investment banker, corporate in past, now [employer]      Objective  Objective:  BP 110/80   Pulse (!) 52   Temp 98.3 F (36.8 C)   Ht 5\' 11"  (1.803 m)   Wt 182 lb 12.8 oz (82.9 kg)   SpO2 97%   BMI 25.50 kg/m  Gen: NAD, resting comfortably HEENT: Mucous membranes are moist. Oropharynx normal Neck: no thyromegaly CV: RRR no  murmurs rubs or gallops Lungs: CTAB no crackles, wheeze, rhonchi Abdomen: soft/nontender/nondistended/normal bowel sounds. No rebound or guarding.  Ext: no edema Skin: warm, dry Neuro: grossly normal, moves all extremities, PERRLA   Assessment and Plan  57 y.o. male presenting for annual follow-up and health maintenance counseling.  Health Maintenance counseling: 1. Anticipatory guidance: Patient counseled regarding regular dental exams - advised q6 months- goes at least yearly, eye exams - no vision issues but could consider q5 years,  avoiding smoking and second hand smoke , limiting alcohol to 2 beverages per day - quit drinking years ago to be example for kids, no illicit drugs .   2. Risk factor reduction:  Advised patient of need for regular exercise and diet rich and fruits and vegetables to reduce risk of heart attack and stroke.  Exercise- more biking and deep water running/swimming- this has been challenging for him.  Diet/weight management-weight up 4 pounds since his knee issues but still healthy weight for his muscle mass- he did gain some muscle as was doing more lifting.  Wt Readings from Last 3 Encounters:  11/21/22 182 lb 12.8 oz (82.9 kg)  06/09/22 181 lb 3.5 oz (82.2 kg)  09/07/21 179 lb 12.8 oz (81.6 kg)  3. Immunizations/screenings/ancillary studies-opting out of hepatitis C screening as self-pay.  Had shingles mid 2020 and holding off on Shingrix for now (considering HT still).  Holding off on further COVID-19 vaccinations. Immunization History  Administered Date(s) Administered   Influenza Split 03/07/2011, 05/17/2012   Influenza Whole 02/22/2008, 03/03/2009   Influenza, Quadrivalent, Recombinant, Inj, Pf 03/16/2018   Influenza,inj,Quad PF,6+ Mos 03/25/2013, 05/08/2014, 04/13/2015, 03/21/2019, 03/06/2021   Influenza-Unspecified 04/07/2016, 03/31/2017, 03/25/2020   PFIZER(Purple Top)SARS-COV-2 Vaccination 08/10/2019, 08/31/2019, 04/04/2020   PNEUMOCOCCAL CONJUGATE-20  06/18/2021   PPD Test 06/27/2018   Tdap 03/25/2013  4. Prostate cancer screening- low risk prior PSA trend-continue to trend as he is on finasteride  Lab Results  Component Value Date   PSA 0.19 06/18/2021   PSA 0.21 06/17/2020   PSA 0.20 06/13/2019   5. Colon cancer screening -  tubular adenoma July 11, 2017-he had colonoscopy completed on 01/20/2021 with 5-year repeat planned-no polyps on this check.  6. Skin cancer screening-has seen dermatology  about a year ago - advised regular sunscreen use. Denies worrisome, changing, or new skin lesions.  7. Smoking associated screening (lung cancer screening, AAA screen 65-75, UA)- Never smoker  8. STD screening - opts out as monogamous   Status of chronic or acute concerns   # Atypical chest pain/GERD S: Prior to last visit patient had spoken with cardiology  about tightness in his chest.  Exercise tolerance test was low risk completed by Dr. Shirlee Latch.  Prior CT calcium scoring of 300 was 94th percentile for age in October 2021 so statin has been increased.  Pain was not exertional and was not relieved by rest.  Prior CT cardiac scoring with no evidence of aneurysm.  No calf pain or edema at this time to suggest DVT so we thought pulmonary embolism was also less likely.  Ultimately thought could be acid reflux or anxiety or combination of the 2 and we opted to trial omeprazole 20 mg daily for 1 to 2 months.  We also discussed possibly adding Lexapro-he is a very high performer which can add to stress levels A/P: pain was likely gastroesophageal reflux disease related. GERD improved with proton pump inhibitor (PPI) stomach acid reducer and has not returned off of medicine- continue to monitor    #hyperlipidemia #coronary atherosclerosis  S: Medication: Atorvastatin 80 mg-previously increased after CT calcium score being elevated, aspirin 81 mg -wonders if levels will be worse with diet and being lower on exercise Lab Results  Component Value Date    CHOL 109 06/18/2021   HDL 40.20 06/18/2021   LDLCALC 60 06/18/2021   TRIG 43.0 06/18/2021   CHOLHDL 3 06/18/2021   A/P: hopefully stable or improved- update lipid panel today. Continue current meds for now  Known coronary atherosclerosis but thankfully reassuring stress test last year and we are being aggressive with statin and aspirin to prevent any worsening   # Right knee effusion-working with Dr. August Saucer after right knee partial medial meniscectomy with treatment for medial femoral condyle chondromalacia with small kissing lesion on the tibia-he had tried to decrease loadbearing exercises and doing more resistance in the water.  He is still hoping to get back to racquet sports and tennis 1-2 times a week to avoid knee replacement -very challenging for him as he has dedicated so much to the sports   #Bilirubin high per baseline  #athletic heart rate plus family tends to run low- monitor  #asthma doing well on fluticasone-salmeterol 1 puff twice daily and singular  Recommended follow up: Return in about 1 year (around 11/21/2023) for followup or sooner if needed.Schedule b4 you leave.  Lab/Order associations: fasting   ICD-10-CM   1. Hyperlipidemia, unspecified hyperlipidemia type  E78.5 Comprehensive metabolic panel    CBC with Differential/Platelet    Lipid panel    2. Screening for prostate cancer  Z12.5 PSA    3. Gastroesophageal reflux disease without esophagitis  K21.9     4. Asthma in adult, mild intermittent, uncomplicated  J45.20 montelukast (SINGULAIR) 10 MG tablet      Meds ordered this encounter  Medications   montelukast (SINGULAIR) 10 MG tablet    Sig: Take 1 tablet (10 mg total) by mouth at bedtime.    Dispense:  90 tablet    Refill:  3   Fluticasone-Salmeterol 55-14 MCG/ACT AEPB    Sig: INHALE 1 PUFF INTO LUNGS TWICE DAILY    Dispense:  3 each    Refill:  3    Patient may fill anytime between now and December 17th, 2024   finasteride (PROSCAR) 5 MG tablet     Sig: TAKE 1/4 (ONE-FOURTH)TABLET BY MOUTH ONCE DAILY    Dispense:  25 tablet    Refill:  3   atorvastatin (LIPITOR) 80 MG tablet    Sig: Take 1 tablet (80 mg total) by mouth daily.    Dispense:  90  tablet    Refill:  3    Return precautions advised.  Tana Conch, MD

## 2022-11-21 NOTE — Patient Instructions (Addendum)
Let us know if you get any COVID vaccines this fall or your [facility] vaccine.  Please stop by lab before you go If you have mychart- we will send your results within 3 business days of [place] receiving them.  If you do not have mychart- we will call you about results within 5 business days of [place] receiving them.  *please also note that you will see labs on mychart as soon as they post. I will later go in and write notes on them- will say "notes from Dr. Durene Cal"   Recommended follow up: Return in about 1 year (around 11/21/2023) for followup or sooner if needed.Schedule b4 you leave.

## 2023-08-30 ENCOUNTER — Ambulatory Visit: Payer: Self-pay | Admitting: Orthopedic Surgery

## 2023-08-30 ENCOUNTER — Encounter: Payer: Self-pay | Admitting: Orthopedic Surgery

## 2023-08-30 ENCOUNTER — Other Ambulatory Visit (INDEPENDENT_AMBULATORY_CARE_PROVIDER_SITE_OTHER): Payer: Self-pay

## 2023-08-30 DIAGNOSIS — M25552 Pain in left hip: Secondary | ICD-10-CM

## 2023-08-30 NOTE — Progress Notes (Signed)
 Office Visit Note   Patient: [name]           Date of Birth: Jul 13, 1965           MRN: [id number] Visit Date: 08/30/2023 Requested by: Shelva Majestic, MD [address] [place],  [place] [address] PCP: Shelva Majestic, MD  Subjective: Chief Complaint  Patient presents with   Left Hip - Pain    No Falls or injuries Left hip Pain  No numbness or tingling Coach     HPI: [name] is a 58 y.o. male who presents to the office reporting left hip pain of 5 days.  Event happened a week ago.  Reports superior pain in the trochanteric region with subsequent bruising and ecchymosis.  He was able to play 2 hours after the event.  Feels like the pain is deep.  Did have some limping early on..                ROS: All systems reviewed are negative as they relate to the chief complaint within the history of present illness.  Patient denies fevers or chills.  Assessment & Plan: Visit Diagnoses:  1. Pain of left hip     Plan: Impression is left hip pain with significant ecchymosis and bruising consistent with possible partial or complete gluteus medius or minimus tear.  Patient is very active and plays pickle ball at a high level.  Needs MRI scan of the left hip to evaluate gluteus tendon tear in order to guide therapeutic recommendations particularly regarding avoidance of activity.  Follow-Up Instructions: No follow-ups on file.   Orders:  Orders Placed This Encounter  Procedures   XR HIP UNILAT W OR W/O PELVIS 2-3 VIEWS LEFT   MR Hip Left w/o contrast   No orders of the defined types were placed in this encounter.     Procedures: No procedures performed   Clinical Data: No additional findings.  Objective: Vital Signs: There were no vitals taken for this visit.  Physical Exam:  Constitutional: Patient appears well-developed HEENT:  Head: Normocephalic Eyes:EOM are normal Neck: Normal range of motion Cardiovascular: Normal rate Pulmonary/chest: Effort  normal Neurologic: Patient is alert Skin: Skin is warm Psychiatric: Patient has normal mood and affect  Ortho Exam: Ortho exam demonstrates about 2 palm sized area of ecchymosis which is resolving around the trochanteric region.  No groin pain with internal/external rotation of the leg.  Radiographs unremarkable.  Has very good hip flexion abduction adduction strength.  No masses lymphadenopathy or skin changes noted in that left hip region.  Specialty Comments:  No specialty comments available.  Imaging: No results found.   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   Hamstring tendinitis of right thigh 09/12/2014   Hyperlipidemia 09/11/2012   Medial epicondylitis of right elbow 06/30/2011   Bronchitis with asthma, acute 08/24/2007   Seasonal and perennial allergic rhinitis 05/16/2007   Asthma, mild intermittent, well-controlled 05/16/2007   Past Medical History:  Diagnosis Date   Allergy    Anxiety    Asthma    BPPV (benign paroxysmal positional vertigo)    X2 2022   Bronchopneumonia    Recurrent   COVID-19    X2   Hyperlipidemia    Shingles     Family History  Problem Relation Age of Onset   Healthy Mother    Hyperlipidemia Father    Hyperlipidemia Sister    Healthy Brother    Heart disease Maternal Grandmother    Heart disease Maternal Grandfather    Allergic rhinitis Daughter    Colon cancer Neg Hx    Esophageal cancer Neg Hx    Pancreatic cancer Neg Hx    Rectal cancer Neg Hx    Stomach cancer Neg Hx    Colon polyps Neg Hx     Past Surgical History:  Procedure Laterality Date   COLONOSCOPY     KNEE ARTHROSCOPY Right 06/09/2022   Procedure: RIGHT KNEE ARTHROSCOPY, MENISCAL DEBRIDEMENT, MICROFRACTURE;  Surgeon: Cammy Copa, MD;  Location: [facility];  Service: Orthopedics;  Laterality: Right;   POLYPECTOMY     torn labrum right shoulder  06/06/2006   VASECTOMY  06/06/2005   Social History   Occupational History   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

## 2023-09-01 ENCOUNTER — Inpatient Hospital Stay: Admission: RE | Admit: 2023-09-01 | Payer: Self-pay | Source: Ambulatory Visit

## 2023-09-04 ENCOUNTER — Ambulatory Visit
Admission: RE | Admit: 2023-09-04 | Discharge: 2023-09-04 | Disposition: A | Discharge status: Home / Self Care | Payer: Self-pay | Source: Ambulatory Visit | Attending: Orthopedic Surgery | Admitting: Orthopedic Surgery

## 2023-09-04 DIAGNOSIS — M25552 Pain in left hip: Secondary | ICD-10-CM

## 2023-09-11 NOTE — Progress Notes (Signed)
 Hi [name].  I talked with the radiologist about the repeat imaging studies done yesterday.  In general there is not really an avulsion off of the iliac crest this looks more to be a hole in the tensor fascia lata/iliotibial band.  Maybe about 2 cm max diameter which allowed some of the swelling to go out into the subcutaneous tissues.  I do not think that surgery is indicated at this time due to the presence of most of the iliac crest attachment of the ITB and TFL.  Discussed this with [name] today.  Going to go with nonoperative therapy for now.  The radiologist who read the study is not the same who read the original study.  I talked with Dr. Guinevere Ferrari at length today even though he was not involved in the original study.

## 2023-09-27 NOTE — Progress Notes (Signed)
 I called and talked to [place].

## 2023-11-06 ENCOUNTER — Ambulatory Visit: Payer: Self-pay | Admitting: Family Medicine

## 2023-11-06 ENCOUNTER — Encounter: Payer: Self-pay | Admitting: Family Medicine

## 2023-11-06 ENCOUNTER — Ambulatory Visit (INDEPENDENT_AMBULATORY_CARE_PROVIDER_SITE_OTHER): Payer: Self-pay | Admitting: Family Medicine

## 2023-11-06 VITALS — BP 100/62 | HR 48 | Temp 97.2°F | Ht 71.0 in | Wt 184.6 lb

## 2023-11-06 DIAGNOSIS — Z0000 Encounter for general adult medical examination without abnormal findings: Secondary | ICD-10-CM

## 2023-11-06 DIAGNOSIS — E785 Hyperlipidemia, unspecified: Secondary | ICD-10-CM

## 2023-11-06 DIAGNOSIS — Z125 Encounter for screening for malignant neoplasm of prostate: Secondary | ICD-10-CM

## 2023-11-06 LAB — CBC WITH DIFFERENTIAL/PLATELET
Basophils Absolute: 0 10*3/uL (ref 0.0–0.1)
Basophils Relative: 0.4 % (ref 0.0–3.0)
Eosinophils Absolute: 0.1 10*3/uL (ref 0.0–0.7)
Eosinophils Relative: 1.8 % (ref 0.0–5.0)
HCT: 43.2 % (ref 39.0–52.0)
Hemoglobin: 15 g/dL (ref 13.0–17.0)
Lymphocytes Relative: 28.5 % (ref 12.0–46.0)
Lymphs Abs: 1.9 10*3/uL (ref 0.7–4.0)
MCHC: 34.7 g/dL (ref 30.0–36.0)
MCV: 91.2 fl (ref 78.0–100.0)
Monocytes Absolute: 0.5 10*3/uL (ref 0.1–1.0)
Monocytes Relative: 8 % (ref 3.0–12.0)
Neutro Abs: 4.1 10*3/uL (ref 1.4–7.7)
Neutrophils Relative %: 61.3 % (ref 43.0–77.0)
Platelets: 170 10*3/uL (ref 150.0–400.0)
RBC: 4.73 Mil/uL (ref 4.22–5.81)
RDW: 13.1 % (ref 11.5–15.5)
WBC: 6.7 10*3/uL (ref 4.0–10.5)

## 2023-11-06 LAB — COMPREHENSIVE METABOLIC PANEL WITH GFR
ALT: 37 U/L (ref 0–53)
AST: 28 U/L (ref 0–37)
Albumin: 4.2 g/dL (ref 3.5–5.2)
Alkaline Phosphatase: 64 U/L (ref 39–117)
BUN: 15 mg/dL (ref 6–23)
CO2: 26 meq/L (ref 19–32)
Calcium: 9 mg/dL (ref 8.4–10.5)
Chloride: 104 meq/L (ref 96–112)
Creatinine, Ser: 0.84 mg/dL (ref 0.40–1.50)
GFR: 96.74 mL/min (ref >60.00)
Glucose, Bld: 78 mg/dL (ref 70–99)
Potassium: 4 meq/L (ref 3.5–5.1)
Sodium: 139 meq/L (ref 135–145)
Total Bilirubin: 0.9 mg/dL (ref 0.2–1.2)
Total Protein: 6.7 g/dL (ref 6.0–8.3)

## 2023-11-06 LAB — LIPID PANEL
Cholesterol: 84 mg/dL (ref 0–200)
HDL: 31.4 mg/dL — ABNORMAL LOW (ref >39.00)
LDL Cholesterol: 43 mg/dL (ref 0–99)
NonHDL: 53.03
Total CHOL/HDL Ratio: 3
Triglycerides: 52 mg/dL (ref 0.0–149.0)
VLDL: 10.4 mg/dL (ref 0.0–40.0)

## 2023-11-06 LAB — PSA: PSA: 0.14 ng/mL (ref 0.10–4.00)

## 2023-11-06 MED ORDER — PREDNISONE 20 MG PO TABS: ORAL_TABLET | ORAL | 0 refills | Status: AC

## 2023-11-06 MED ORDER — AZITHROMYCIN 250 MG PO TABS: ORAL_TABLET | ORAL | 0 refills | Status: AC

## 2023-11-06 NOTE — Patient Instructions (Addendum)
 Consider shingrix shot when feeling better  Now due for Tetanus, Diphtheria, and Pertussis (Tdap) - would be about 180 here from my recollection- another name for this is boostrix  Concern for walking pneumonia vs bronchitis- known asthma history as well complicates this. We opted to empirically treat with prednisone  to cover for asthma and azithromycin  to cover for walking pneumonia . If not improving within a week or if symptoms worsen we discussed getting x-ray. Certainly need to know if fevers or shortness of breath at this point   Recommended follow up: Return in about 1 year (around 11/05/2024) for followup or sooner if needed.Schedule b4 you leave.

## 2023-11-06 NOTE — Progress Notes (Signed)
 Phone: [phone]   Subjective:  Patient presents today for their annual visit for chronic condition and health maintenance counseling. Chief complaint-noted.   See problem oriented charting- ROS- full  review of systems was completed and negative  except for: Dry cough  The following were reviewed and entered/updated in epic: Past Medical History:  Diagnosis Date   Allergy     Anxiety    Asthma    BPPV (benign paroxysmal positional vertigo)    X2 2022   Bronchopneumonia    Recurrent   COVID-19    X2   Hyperlipidemia    Shingles    Patient Active Problem List   Diagnosis Date Noted   Hyperlipidemia 09/11/2012    Priority: Medium    Bronchitis with asthma, acute 08/24/2007    Priority: Medium    Asthma, mild intermittent, well-controlled 05/16/2007    Priority: Medium    GERD (gastroesophageal reflux disease) 11/21/2022    Priority: Low   Male pattern baldness 04/13/2015    Priority: Low   Seasonal and perennial allergic rhinitis 05/16/2007    Priority: Low   Chondromalacia, 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   Acute medial meniscal tear, sequela 06/25/2022    Priority: 1.   Hamstring tendinitis of right thigh 09/12/2014    Priority: 1.   Medial epicondylitis of right elbow 06/30/2011    Priority: 1.   Past Surgical History:  Procedure Laterality Date   COLONOSCOPY     KNEE ARTHROSCOPY Right 06/09/2022   Procedure: RIGHT KNEE ARTHROSCOPY, MENISCAL DEBRIDEMENT, MICROFRACTURE;  Surgeon: Jasmine Mesi, MD;  [facility]  [facility];  Service: Orthopedics;  Laterality: Right;   POLYPECTOMY     torn labrum right shoulder  06/06/2006   VASECTOMY  06/06/2005    Family History  Problem Relation Age of Onset   Healthy Mother    Hyperlipidemia Father    Hyperlipidemia Sister    Healthy Brother    Heart disease Maternal Grandmother    Heart disease Maternal Grandfather    Allergic rhinitis Daughter    Colon cancer Neg Hx    Esophageal cancer  Neg Hx    Pancreatic cancer Neg Hx    Rectal cancer Neg Hx    Stomach cancer Neg Hx    Colon polyps Neg Hx     Medications- reviewed and updated Current Outpatient Medications  Medication Sig Dispense Refill   albuterol  (VENTOLIN  HFA) 108 (90 Base) MCG/ACT inhaler Inhale 2 puffs into the lungs every 6 (six) hours as needed for wheezing or shortness of breath (rescue inhaler). 18 g 3   aspirin  EC 81 MG tablet Take 1 tablet (81 mg total) by mouth 2 (two) times daily. 30 tablet 0   atorvastatin  (LIPITOR) 80 MG tablet Take 1 tablet (80 mg total) by mouth daily. 90 tablet 3   azithromycin  (ZITHROMAX ) 250 MG tablet Take 2 tablets on day 1, then 1 tablet daily on days 2 through 5 6 tablet 0   Fexofenadine HCl (ALLEGRA ALLERGY  PO) Take by mouth as needed.     finasteride  (PROSCAR ) 5 MG tablet TAKE 1/4 (ONE-FOURTH)TABLET BY MOUTH ONCE DAILY 25 tablet 3   Fluticasone -Salmeterol 55-14 MCG/ACT AEPB INHALE 1 PUFF INTO LUNGS TWICE DAILY 3 each 3   melatonin 3 MG TABS tablet Take 3 mg by mouth at bedtime as needed.     montelukast  (SINGULAIR ) 10 MG tablet Take 1 tablet (10 mg total) by mouth at bedtime. 90 tablet 3   predniSONE  (DELTASONE ) 20  MG tablet Take 2 pills for 3 days, 1 pill for 4 days 10 tablet 0   No current facility-administered medications for this visit.    Allergies-reviewed and updated Allergies  Allergen Reactions   Minocycline Rash    Erythema Multiforme    Social History   Social History Narrative   Married. 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 Enjoys work. Worked in [place] for 7 years before that. [employer] for 5 years.    Father is retired Environmental health practitioner is physician (endocrinologist)      Hobbies: tennis and raquetball, time with kids and family, church- Investment banker, corporate in past, now [employer]      Objective  Objective:  BP  100/62   Pulse (!) 48   Temp (!) 97.2 F (36.2 C)   Ht 5\' 11"  (1.803 m)   Wt 184 lb 9.6 oz (83.7 kg)   SpO2 96%   BMI 25.75 kg/m  Gen: NAD, resting comfortably HEENT: Mucous membranes are moist. Oropharynx normal Neck: no thyromegaly CV: RRR no murmurs rubs or gallops Lungs: prominent bilateral rhonchi but otherwise clear Abdomen: soft/nontender/nondistended/normal bowel sounds. No rebound or guarding.  Ext: no edema Skin: warm, dry Neuro: grossly normal, moves all extremities, PERRLA    Assessment and Plan  58 y.o. male presenting for annual health maintenance counseling.  Health Maintenance counseling: 1. Anticipatory guidance: Patient counseled regarding regular dental exams - yearly, eye exams - no issues- consider updating,  avoiding smoking and second hand smoke , limiting alcohol to 2 beverages per day-does not drink to be an example for his kids, no illicit drugs.  2. Risk factor reduction:  Advised patient of need for regular exercise and diet rich and fruits and vegetables to reduce risk of heart attack and stroke.  Exercise- still excellent. Gym 3-4 days a week, low weights, some biking, pickleball when healthy 3-4 days a week trying to pull himself back to 3. Some tennis Diet/weight management-weight up 2 pounds from last physical- eating reasonably clean- feels could pull back on cookie dough and chips and salsa.  Wt Readings from Last 3 Encounters:  11/06/23 184 lb 9.6 oz (83.7 kg)  11/21/22 182 lb 12.8 oz (82.9 kg)  06/09/22 181 lb 3.5 oz (82.2 kg)  3. Immunizations/screenings/ancillary studies-as self-pay holding off on hep C screening.  Holding off on Shingrix as had shingles mid 2020 but he is still considering.  Did recommend Tetanus, Diphtheria, and Pertussis (Tdap)since he is so active Immunization History  Administered Date(s) Administered   Influenza Split 03/07/2011, 05/17/2012   Influenza Whole 02/22/2008, 03/03/2009   Influenza, Quadrivalent, Recombinant,  Inj, Pf 03/16/2018   Influenza,inj,Quad PF,6+ Mos 03/25/2013, 05/08/2014, 04/13/2015, 03/21/2019, 03/06/2021   Influenza-Unspecified 04/07/2016, 03/31/2017, 03/25/2020   PFIZER(Purple Top)SARS-COV-2 Vaccination 08/10/2019, 08/31/2019, 04/04/2020   PNEUMOCOCCAL CONJUGATE-20 06/18/2021   PPD Test 06/27/2018   Tdap 03/25/2013  4. Prostate cancer screening- low risk prior trend- update PSA today.  Finasteride  definitely keeps PSA overall lower Lab Results  Component Value Date   PSA 0.22 11/21/2022   PSA 0.19 06/18/2021   PSA 0.21 06/17/2020   5. Colon cancer screening - January 20, 2021 with 5-year repeat planned with prior tubular adenoma in 2019 6. Skin cancer screening- dermatology every few years or as neededadvised regular sunscreen use. Denies worrisome, changing, or new skin lesions.  7. Smoking associated screening (lung cancer screening, AAA screen 65-75, UA)- never smoker 8. STD screening -opts out  as monogamous  Status of chronic or acute concerns   # Dry cough S: Patient reports dry cough started roughly 15 days ago but worsening in last 4-5 days.  Denies shortness of breath or wheeze- but some rattling.  He is concerned for walking pneumonia.  Notes more fatigue/run down. Sputum slightly yellow or green or clear- not thick green sputum -He does have baseline asthma which has been generally controlled with very sparing albuterol  with last refill in 2021 as well as Advair 55-14 mcg 1 puff twice daily - Also takes Singulair  and Allegra for allergies -reports had walking pneumonia about 5 years ago at least -on dayquil, Nyquil, Flonase - peak flow down 15-20%h A/P: Concern for walking pneumonia vs bronchitis (significant rhonchi today on exam but no wheeze)- known asthma history as well complicates this. We opted to empirically treat with prednisone  to cover for asthma and azithromycin  to cover for walking pneumonia . If not improving within a week or if symptoms worsen we discussed  getting x-ray. Certainly need to know if fevers or shortness of breath at this point   -we opted not to test for COVID as would likely not change management  # Coronary atherosclerosis-based on CT calcium  score of 300 which was 94th % #hyperlipidemia S: Medication:Aspirin  81 mg, atorvastatin  80 mg  Lab Results  Component Value Date   CHOL 90 11/21/2022   HDL 32.90 (L) 11/21/2022   LDLCALC 48 11/21/2022   TRIG 45.0 11/21/2022   CHOLHDL 3 11/21/2022   A/P: lipids looked great last year but I think his idea on lpa is a great idea to test especially with plaque deposition levels compared to his lifestyle.    # Male pattern baldness S: Patient used to take finasteride  5 mg just 1/4 tablet daily A/P: doing well with this- continue to check psa   # Right knee effusion last year got better then ended up hurting hip with pickleball- feels needs to work on recover time-working with Dr. Rozelle Corning - thankfully injury on hip ended up not being surgical. Between these 2 has been down 9 of last 18 months  # High baseline bilirubin-we will monitor with labs today   # Athletic heart rate- noted bradycardia once again.    Recommended follow up: Return in about 1 year (around 11/05/2024) for followup or sooner if needed.Schedule b4 you leave.  Lab/Order associations fasting   ICD-10-CM   1. Preventative health care  Z00.00     2. Hyperlipidemia, unspecified hyperlipidemia type  E78.5 Comprehensive metabolic panel with GFR    CBC with Differential/Platelet    Lipid panel    Lipoprotein A (LPA)    3. Screening for prostate cancer  Z12.5 PSA      Meds ordered this encounter  Medications   predniSONE  (DELTASONE ) 20 MG tablet    Sig: Take 2 pills for 3 days, 1 pill for 4 days    Dispense:  10 tablet    Refill:  0   azithromycin  (ZITHROMAX ) 250 MG tablet    Sig: Take 2 tablets on day 1, then 1 tablet daily on days 2 through 5    Dispense:  6 tablet    Refill:  0    Return precautions advised.   Clarisa Crooked, MD

## 2023-11-09 LAB — LIPOPROTEIN A (LPA): Lipoprotein (a): 35 nmol/L (ref <75)

## 2023-11-24 ENCOUNTER — Other Ambulatory Visit: Payer: Self-pay | Admitting: Family Medicine

## 2023-12-06 ENCOUNTER — Other Ambulatory Visit: Payer: Self-pay | Admitting: Family Medicine

## 2024-01-15 ENCOUNTER — Other Ambulatory Visit: Payer: Self-pay | Admitting: Family Medicine

## 2024-01-17 ENCOUNTER — Other Ambulatory Visit: Payer: Self-pay | Admitting: Family Medicine

## 2024-01-17 DIAGNOSIS — J4520 Mild intermittent asthma, uncomplicated: Secondary | ICD-10-CM

## 2024-04-08 ENCOUNTER — Encounter: Payer: Self-pay | Admitting: Radiology

## 2024-11-06 ENCOUNTER — Encounter: Payer: Self-pay | Admitting: Family Medicine
